# Patient Record
Sex: Female | Born: 1970 | Race: Black or African American | Hispanic: No | Marital: Single | Smoking: Never smoker
Health system: Southern US, Community
[De-identification: ages and names within clinical notes are randomized; demographics above are authoritative.]

## PROBLEM LIST (undated history)

## (undated) DIAGNOSIS — G43829 Menstrual migraine, not intractable, without status migrainosus: Secondary | ICD-10-CM

## (undated) DIAGNOSIS — D649 Anemia, unspecified: Secondary | ICD-10-CM

## (undated) DIAGNOSIS — F32A Depression, unspecified: Secondary | ICD-10-CM

## (undated) DIAGNOSIS — F329 Major depressive disorder, single episode, unspecified: Secondary | ICD-10-CM

## (undated) DIAGNOSIS — L309 Dermatitis, unspecified: Secondary | ICD-10-CM

## (undated) DIAGNOSIS — J309 Allergic rhinitis, unspecified: Secondary | ICD-10-CM

## (undated) HISTORY — DX: Major depressive disorder, single episode, unspecified: F32.9

## (undated) HISTORY — DX: Menstrual migraine, not intractable, without status migrainosus: G43.829

## (undated) HISTORY — PX: WISDOM TOOTH EXTRACTION: SHX21

## (undated) HISTORY — DX: Depression, unspecified: F32.A

## (undated) HISTORY — DX: Allergic rhinitis, unspecified: J30.9

## (undated) HISTORY — DX: Dermatitis, unspecified: L30.9

---

## 1999-09-11 ENCOUNTER — Other Ambulatory Visit: Admission: RE | Admit: 1999-09-11 | Discharge: 1999-09-11 | Payer: Self-pay | Admitting: Obstetrics and Gynecology

## 2000-09-09 ENCOUNTER — Other Ambulatory Visit: Admission: RE | Admit: 2000-09-09 | Discharge: 2000-09-09 | Payer: Self-pay | Admitting: Obstetrics and Gynecology

## 2002-11-29 ENCOUNTER — Emergency Department (HOSPITAL_COMMUNITY): Admission: EM | Admit: 2002-11-29 | Discharge: 2002-11-29 | Payer: Self-pay | Admitting: Emergency Medicine

## 2002-11-29 ENCOUNTER — Encounter: Payer: Self-pay | Admitting: Emergency Medicine

## 2003-02-23 ENCOUNTER — Other Ambulatory Visit: Admission: RE | Admit: 2003-02-23 | Discharge: 2003-02-23 | Payer: Self-pay | Admitting: Obstetrics and Gynecology

## 2004-03-13 ENCOUNTER — Other Ambulatory Visit: Admission: RE | Admit: 2004-03-13 | Discharge: 2004-03-13 | Payer: Self-pay | Admitting: Obstetrics and Gynecology

## 2004-12-12 ENCOUNTER — Encounter: Admission: RE | Admit: 2004-12-12 | Discharge: 2004-12-12 | Payer: Self-pay | Admitting: Family Medicine

## 2005-03-23 ENCOUNTER — Other Ambulatory Visit: Admission: RE | Admit: 2005-03-23 | Discharge: 2005-03-23 | Payer: Self-pay | Admitting: Obstetrics and Gynecology

## 2005-03-30 ENCOUNTER — Ambulatory Visit (HOSPITAL_COMMUNITY): Admission: RE | Admit: 2005-03-30 | Discharge: 2005-03-30 | Payer: Self-pay | Admitting: Obstetrics and Gynecology

## 2005-07-06 HISTORY — PX: BUNIONECTOMY: SHX129

## 2006-02-10 ENCOUNTER — Ambulatory Visit: Payer: Self-pay | Admitting: Family Medicine

## 2006-04-12 ENCOUNTER — Ambulatory Visit (HOSPITAL_COMMUNITY): Admission: RE | Admit: 2006-04-12 | Discharge: 2006-04-12 | Payer: Self-pay | Admitting: Obstetrics and Gynecology

## 2006-04-12 ENCOUNTER — Ambulatory Visit: Payer: Self-pay | Admitting: Family Medicine

## 2006-04-28 ENCOUNTER — Other Ambulatory Visit: Admission: RE | Admit: 2006-04-28 | Discharge: 2006-04-28 | Payer: Self-pay | Admitting: Obstetrics & Gynecology

## 2006-06-15 ENCOUNTER — Other Ambulatory Visit: Admission: RE | Admit: 2006-06-15 | Discharge: 2006-06-15 | Payer: Self-pay | Admitting: Obstetrics & Gynecology

## 2006-08-04 ENCOUNTER — Other Ambulatory Visit: Admission: RE | Admit: 2006-08-04 | Discharge: 2006-08-04 | Payer: Self-pay | Admitting: Obstetrics & Gynecology

## 2006-10-21 ENCOUNTER — Ambulatory Visit: Payer: Self-pay | Admitting: Family Medicine

## 2006-11-26 ENCOUNTER — Ambulatory Visit: Payer: Self-pay | Admitting: Family Medicine

## 2007-05-09 ENCOUNTER — Other Ambulatory Visit: Admission: RE | Admit: 2007-05-09 | Discharge: 2007-05-09 | Payer: Self-pay | Admitting: Obstetrics and Gynecology

## 2007-08-26 ENCOUNTER — Ambulatory Visit: Payer: Self-pay | Admitting: Family Medicine

## 2007-10-07 ENCOUNTER — Ambulatory Visit: Payer: Self-pay | Admitting: Family Medicine

## 2008-05-03 ENCOUNTER — Ambulatory Visit: Payer: Self-pay | Admitting: Family Medicine

## 2008-05-16 ENCOUNTER — Other Ambulatory Visit: Admission: RE | Admit: 2008-05-16 | Discharge: 2008-05-16 | Payer: Self-pay | Admitting: Obstetrics & Gynecology

## 2008-07-16 ENCOUNTER — Ambulatory Visit: Payer: Self-pay | Admitting: Family Medicine

## 2008-08-22 ENCOUNTER — Ambulatory Visit: Payer: Self-pay | Admitting: Family Medicine

## 2008-11-15 ENCOUNTER — Ambulatory Visit: Payer: Self-pay | Admitting: Family Medicine

## 2008-12-28 ENCOUNTER — Ambulatory Visit (HOSPITAL_COMMUNITY): Admission: RE | Admit: 2008-12-28 | Discharge: 2008-12-28 | Payer: Self-pay | Admitting: Obstetrics and Gynecology

## 2009-02-14 ENCOUNTER — Ambulatory Visit: Payer: Self-pay | Admitting: Family Medicine

## 2009-06-03 ENCOUNTER — Ambulatory Visit: Payer: Self-pay | Admitting: Family Medicine

## 2009-06-14 ENCOUNTER — Encounter: Admission: RE | Admit: 2009-06-14 | Discharge: 2009-06-14 | Payer: Self-pay | Admitting: Family Medicine

## 2009-06-17 ENCOUNTER — Ambulatory Visit: Payer: Self-pay | Admitting: Family Medicine

## 2009-12-13 ENCOUNTER — Ambulatory Visit: Payer: Self-pay | Admitting: Family Medicine

## 2009-12-31 ENCOUNTER — Ambulatory Visit (HOSPITAL_COMMUNITY): Admission: RE | Admit: 2009-12-31 | Discharge: 2009-12-31 | Payer: Self-pay | Admitting: Obstetrics and Gynecology

## 2010-09-24 IMAGING — CR DG WRIST COMPLETE 3+V*R*
3 series · 3 of 3 positions shown · non-contrast
Comparison: None

CLINICAL DATA: Wrist pain.  No trauma.

RIGHT WRIST - COMPLETE 3+ VIEW

[view not recorded (1 of 3)]
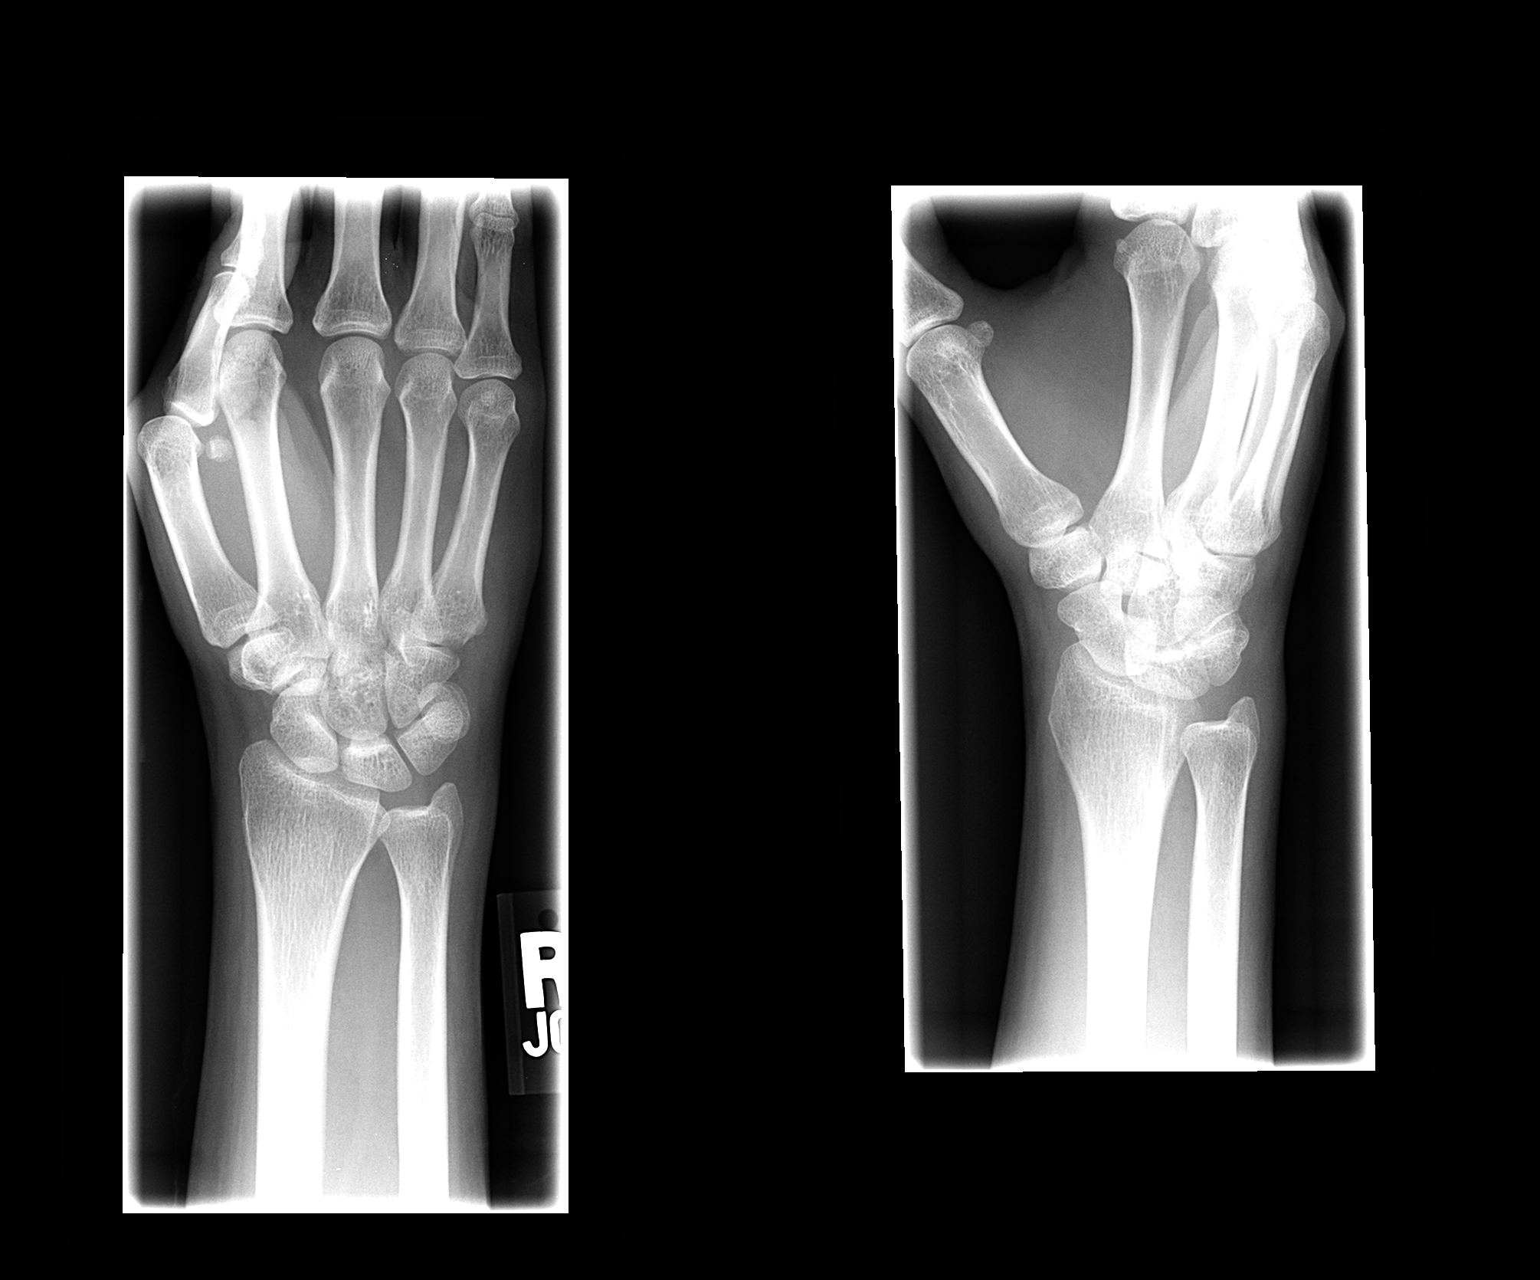

[view not recorded (2 of 3)]
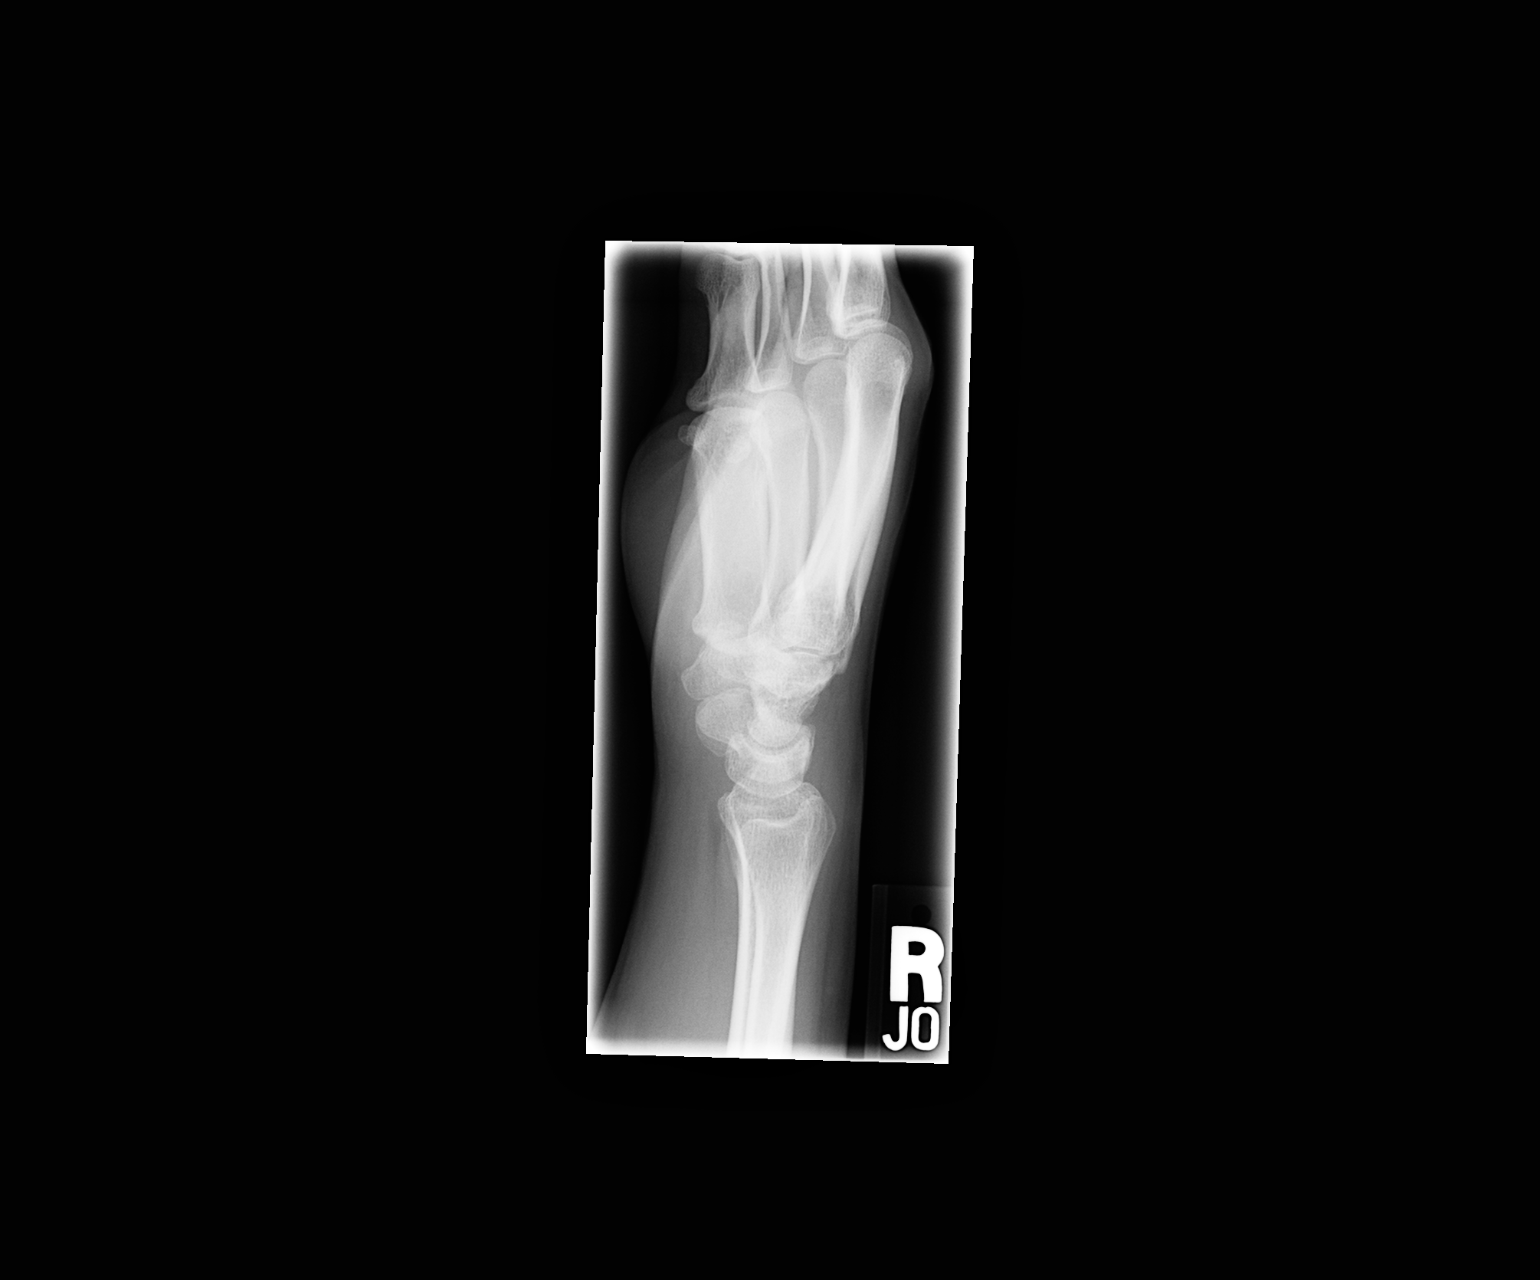

[view not recorded (3 of 3)]
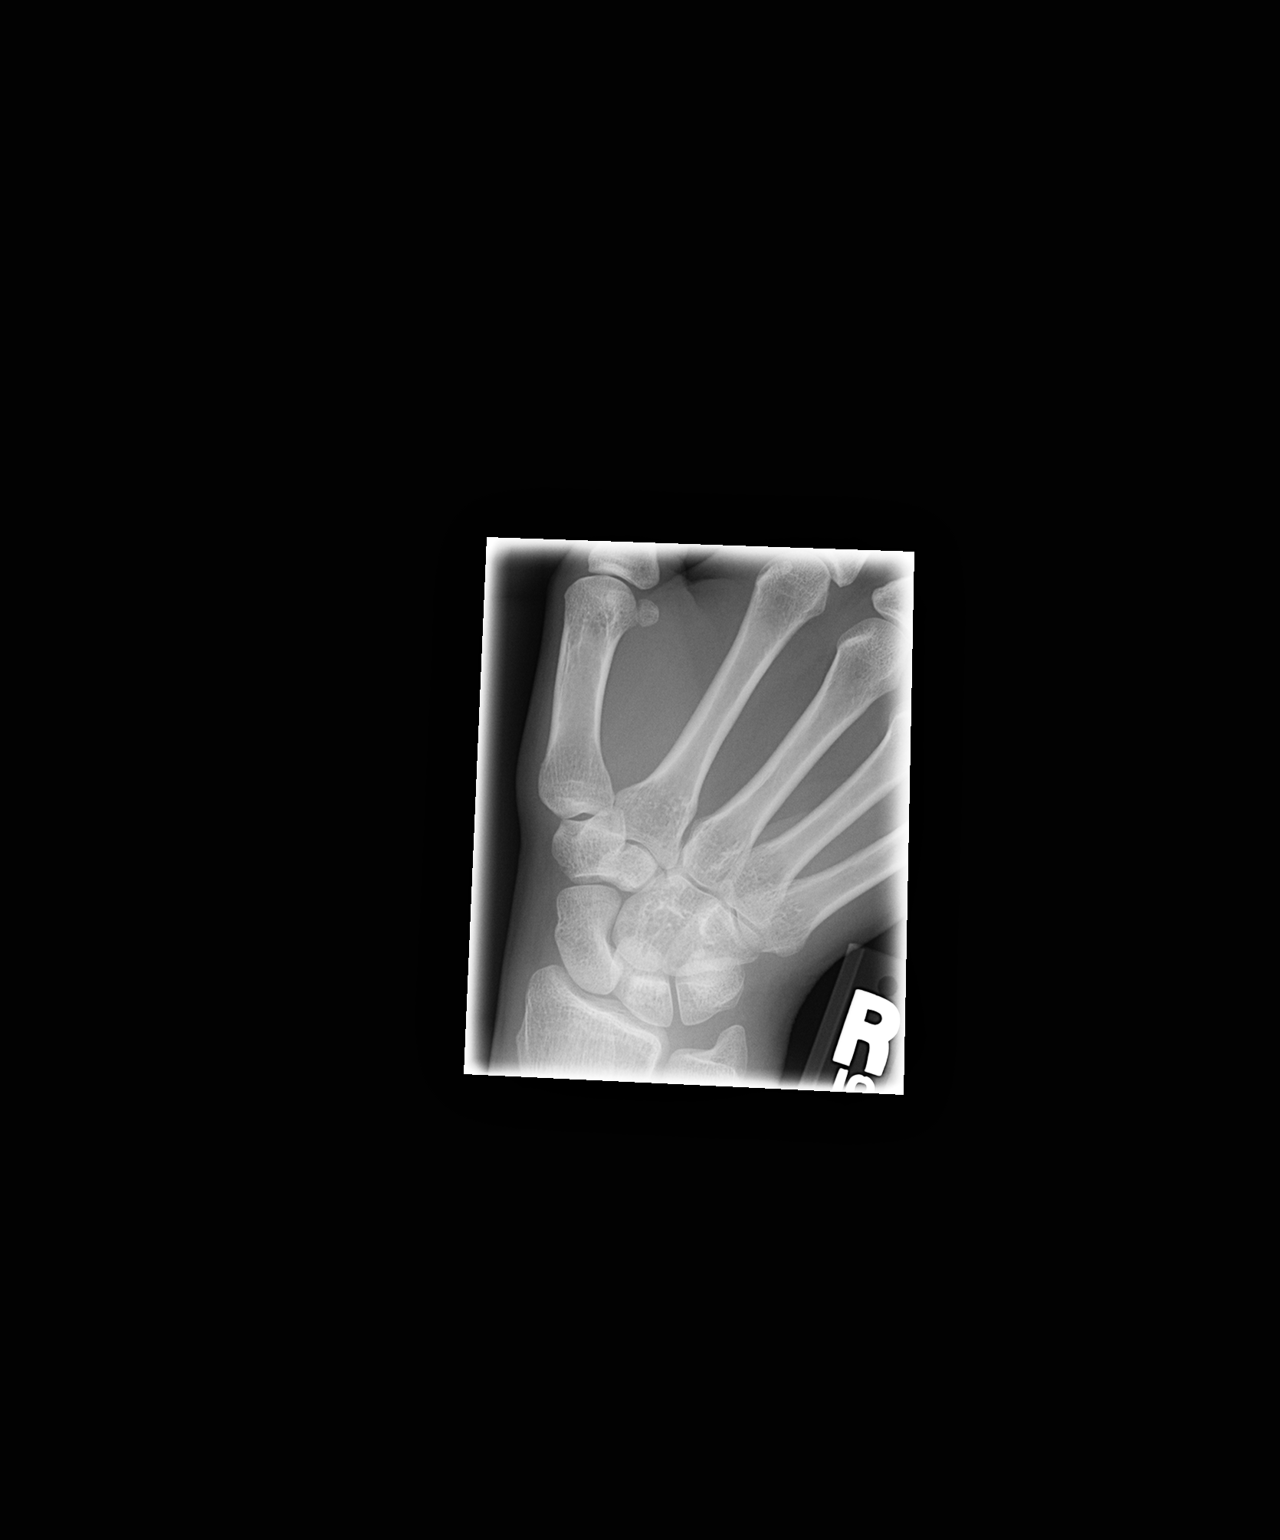

[3 of 3 positions shown; findings below may reference images not displayed]

FINDINGS: No acute bony abnormality.  Specifically, no fracture,
subluxation, or dislocation.  Soft tissues are intact.
IMPRESSION: No acute bony abnormality.

## 2010-11-10 ENCOUNTER — Ambulatory Visit (INDEPENDENT_AMBULATORY_CARE_PROVIDER_SITE_OTHER): Payer: Private Health Insurance - Indemnity | Admitting: Medical

## 2010-11-10 DIAGNOSIS — J309 Allergic rhinitis, unspecified: Secondary | ICD-10-CM

## 2010-11-10 DIAGNOSIS — H6120 Impacted cerumen, unspecified ear: Secondary | ICD-10-CM

## 2010-11-13 ENCOUNTER — Other Ambulatory Visit (HOSPITAL_COMMUNITY): Payer: Self-pay | Admitting: Obstetrics and Gynecology

## 2010-11-13 DIAGNOSIS — Z1231 Encounter for screening mammogram for malignant neoplasm of breast: Secondary | ICD-10-CM

## 2010-11-18 ENCOUNTER — Telehealth: Payer: Self-pay | Admitting: Medical

## 2010-11-18 MED ORDER — AMOXICILLIN 875 MG PO TABS: 875.0000 mg | ORAL_TABLET | Freq: Two times a day (BID) | ORAL | Status: AC

## 2010-11-18 NOTE — Telephone Encounter (Signed)
Patient was called and notified that antibiotic was ready to be pickup.  Patient will pick it up in the morning. CM, LPN

## 2010-11-19 ENCOUNTER — Telehealth: Payer: Self-pay | Admitting: Medical

## 2010-11-19 ENCOUNTER — Encounter: Payer: Self-pay | Admitting: Medical

## 2010-11-19 ENCOUNTER — Other Ambulatory Visit: Payer: Self-pay | Admitting: Medical

## 2010-11-19 MED ORDER — FLUCONAZOLE 150 MG PO TABS: 150.0000 mg | ORAL_TABLET | Freq: Once | ORAL | Status: AC

## 2010-12-03 ENCOUNTER — Telehealth: Payer: Self-pay | Admitting: *Deleted

## 2010-12-03 ENCOUNTER — Encounter: Payer: Self-pay | Admitting: Medical

## 2010-12-03 NOTE — Telephone Encounter (Signed)
Pull chart as this must have been prior to epic note.     This is in reference to phone call today about the antibiotic

## 2010-12-03 NOTE — Telephone Encounter (Signed)
pls CALL in Amoxicillin 875mg  1 tablet BID x 10 days #20 no refill to pharmacy.

## 2010-12-15 ENCOUNTER — Ambulatory Visit (HOSPITAL_COMMUNITY)
Admission: RE | Admit: 2010-12-15 | Discharge: 2010-12-15 | Disposition: A | Discharge status: Home / Self Care | Payer: Private Health Insurance - Indemnity | Source: Ambulatory Visit | Attending: Obstetrics and Gynecology | Admitting: Obstetrics and Gynecology

## 2010-12-15 DIAGNOSIS — Z1231 Encounter for screening mammogram for malignant neoplasm of breast: Secondary | ICD-10-CM

## 2010-12-22 NOTE — Telephone Encounter (Signed)
done

## 2011-01-06 ENCOUNTER — Other Ambulatory Visit: Payer: Self-pay | Admitting: Medical

## 2011-01-06 ENCOUNTER — Telehealth: Payer: Self-pay | Admitting: *Deleted

## 2011-01-06 MED ORDER — CICLESONIDE 50 MCG/ACT NA SUSP: 2.0000 | Freq: Every day | NASAL | Status: DC

## 2011-01-06 NOTE — Telephone Encounter (Addendum)
Message copied by Dorthula Perfect on Tue Jan 06, 2011  2:17 PM ------      Message from: Jac Canavan      Created: Tue Jan 06, 2011  1:56 PM       Pt called requesting script for omnaris, as the flonase doesn't work.  I will send script.   Pt notified rx for omnaris sent to pharmacy.  CM, LPN

## 2011-01-27 ENCOUNTER — Other Ambulatory Visit: Payer: Self-pay | Admitting: Medical

## 2011-01-27 MED ORDER — MOMETASONE FUROATE 50 MCG/ACT NA SUSP: 2.0000 | Freq: Every day | NASAL | Status: DC

## 2011-01-28 ENCOUNTER — Telehealth: Payer: Self-pay | Admitting: Medical

## 2011-01-28 NOTE — Telephone Encounter (Signed)
Sample ready on your desk

## 2011-01-29 NOTE — Telephone Encounter (Signed)
Called pt and left message that Nasonex was ready to be picked up at front desk.  CM, LPN

## 2011-01-30 NOTE — Telephone Encounter (Signed)
Handled by cora °

## 2011-02-04 ENCOUNTER — Ambulatory Visit (INDEPENDENT_AMBULATORY_CARE_PROVIDER_SITE_OTHER): Payer: Private Health Insurance - Indemnity | Admitting: Family Medicine

## 2011-02-04 ENCOUNTER — Encounter: Payer: Self-pay | Admitting: Family Medicine

## 2011-02-04 VITALS — BP 86/60 | HR 92 | Wt 159.0 lb

## 2011-02-04 DIAGNOSIS — J309 Allergic rhinitis, unspecified: Secondary | ICD-10-CM

## 2011-02-04 DIAGNOSIS — J302 Other seasonal allergic rhinitis: Secondary | ICD-10-CM | POA: Insufficient documentation

## 2011-02-04 DIAGNOSIS — J3089 Other allergic rhinitis: Secondary | ICD-10-CM

## 2011-02-04 NOTE — Progress Notes (Signed)
  Subjective:    Patient ID: [name], female    DOB: Jun 30, 1971, 40 y.o.   MRN: [id number]  HPI History of allergies and presently is on Nasonex and has also taken Zyrtec. This combination is not working. She is having difficulty with rhinorrhea and PND. Also itchy watery eyes   Review of Systems     Objective:   Physical Exam alert and in no distress. Tympanic membranes and canals are normal. Throat is clear. Tonsils are normal. Neck is supple without adenopathy or thyromegaly. Cardiac exam shows a regular sinus rhythm without murmurs or gallops. Lungs are clear to auscultation.       Assessment & Plan:  Allergic rhinitis Continue on your Nasonex and to try Allegra and if that doesn't work she will call

## 2011-02-04 NOTE — Patient Instructions (Signed)
Try Allegra and Claritin and if no improvement, call for a prescription medicine

## 2011-09-02 ENCOUNTER — Encounter: Payer: Self-pay | Admitting: Family Medicine

## 2011-09-02 ENCOUNTER — Ambulatory Visit (INDEPENDENT_AMBULATORY_CARE_PROVIDER_SITE_OTHER): Payer: Private Health Insurance - Indemnity | Admitting: Family Medicine

## 2011-09-02 VITALS — BP 98/68 | HR 64 | Temp 98.3°F | Ht 65.0 in | Wt 158.0 lb

## 2011-09-02 DIAGNOSIS — J309 Allergic rhinitis, unspecified: Secondary | ICD-10-CM

## 2011-09-02 DIAGNOSIS — J302 Other seasonal allergic rhinitis: Secondary | ICD-10-CM

## 2011-09-02 MED ORDER — FLUTICASONE PROPIONATE 50 MCG/ACT NA SUSP: 2.0000 | Freq: Every day | NASAL | Status: DC

## 2011-09-02 NOTE — Progress Notes (Signed)
Chief complaint: lots of sinus drainage, coughing and sneezing x 10 days.  HPI:  Started having sinus pressure in her cheeks last week, sneezing.  Started taking Zyrtec, which helped some.  2 days ago had sore throat, having postnasal drainage.  Last night she was coughing a lot, related to the drainage.  Not having much mucus from nose.  Only a small amount of phlegm, first thing in the morning, slightly yellow.  Cough last night, that kept her awake, was dry, nonproductive.  Denies fevers.  Also tried some day and night cold meds, with some improvement.    Has been out of Nasonex for about a month. Recalls having used Flonase in the past, and didn't recall side effects.  Hadn't gotten Nasonex refilled due to the expense.  Past Medical History  Diagnosis Date  . Allergic rhinitis, cause unspecified   . Menstrual migraine     resolved after getting IUD  . Depression     history; resolved  . Eczema     elbow    Past Surgical History  Procedure Date  . Bunionectomy 2007    bilateral    History   Social History  . Marital Status: Single    Spouse Name: N/A    Number of Children: N/A  . 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 Smoking status: Never Smoker   . Smokeless tobacco: Never Used  . Alcohol Use: Yes     1 glass of wine 1-2 times per month.  . Drug Use: No  . Sexually Active: Yes -- Female partner(s)    Birth Control/ Protection: IUD     Mirena    Other Topics Concern  . Not on file   Social History Narrative  . No narrative on file    Family History  Problem Relation Age of Onset  . Hypertension Mother   . Hypertension Sister     Current outpatient prescriptions:fluticasone (FLONASE) 50 MCG/ACT nasal spray, Place 2 sprays into the nose daily., Disp: 16 g, Rfl: 6  Allergies  Allergen Reactions  . Codeine Other (See Comments)    Hyperactivity.    ROS:  Denies fevers, cough,  chest pain, palpitations, nausea, vomiting, diarrhea  PHYSICAL EXAM: BP 98/68  Pulse 64  Temp(Src) 98.3 F (36.8 C) (Oral)  Ht 5\' 5"  (1.651 m)  Wt 158 lb (71.668 kg)  BMI 26.29 kg/m2 Well developed, pleasant female in no distress HEENT:  PERRL, EOMI, Nasal mucosa moderately edematous on L, mild on R, pale, no erythema or purulence.  Sinuses nontender, OP clear Neck: no lymphadenopathy, thyromegaly or mass Lungs clear Heart: regular rate and rhythm without murmur Abdomen: soft, nontender, no mass Extremities: no clubbing, cyanosis or edema  ASSESSMENT/PLAN:  1. Seasonal and perennial allergic rhinitis  fluticasone (FLONASE) 50 MCG/ACT nasal spray   Restart nasal steroid spray. Use 2 sprays into each nostril once daily (may cut back to 1 spray when improving, if able to, but okay to continue longterm at 2 sprays). Continue zyrtec daily. Use decongestant (ie sudafed) only if needed for sinus pain Add guaifenesin (mucinex or Robitussin) to help loosen mucus and help with cough.  You can either use the plain medication, or the DM form.  If you buy the plain, and still have significant cough, you can get a separate Delsym syrup (which is the DM alone--cough suppressant).  If mucus becomes discolored, increased amounts, fevers,  worsening sinus pain, then you likely have developed an infection, requiring antibiotics. No evidence of infection today.

## 2011-09-02 NOTE — Patient Instructions (Signed)
Restart nasal steroid spray. Use 2 sprays into each nostril once daily (may cut back to 1 spray when improving, if able to, but okay to continue longterm at 2 sprays). Continue zyrtec daily. Use decongestant (ie sudafed) only if needed for sinus pain Add guaifenesin (mucinex or Robitussin) to help loosen mucus and help with cough.  You can either use the plain medication, or the DM form.  If you buy the plain, and still have significant cough, you can get a separate Delsym syrup (which is the DM alone--cough suppressant).  If mucus becomes discolored, increased amounts, fevers, worsening sinus pain, then you likely have developed an infection, requiring antibiotics. No evidence of infection today.

## 2011-11-25 ENCOUNTER — Other Ambulatory Visit (HOSPITAL_COMMUNITY): Payer: Self-pay | Admitting: Obstetrics and Gynecology

## 2011-11-25 DIAGNOSIS — Z1231 Encounter for screening mammogram for malignant neoplasm of breast: Secondary | ICD-10-CM

## 2011-12-22 ENCOUNTER — Ambulatory Visit (HOSPITAL_COMMUNITY): Payer: Private Health Insurance - Indemnity

## 2012-01-19 ENCOUNTER — Ambulatory Visit (HOSPITAL_COMMUNITY): Payer: Private Health Insurance - Indemnity

## 2012-02-05 ENCOUNTER — Ambulatory Visit (HOSPITAL_COMMUNITY)
Admission: RE | Admit: 2012-02-05 | Discharge: 2012-02-05 | Disposition: A | Discharge status: Home / Self Care | Payer: Private Health Insurance - Indemnity | Source: Ambulatory Visit | Attending: Obstetrics and Gynecology | Admitting: Obstetrics and Gynecology

## 2012-02-05 DIAGNOSIS — Z1231 Encounter for screening mammogram for malignant neoplasm of breast: Secondary | ICD-10-CM

## 2012-02-25 ENCOUNTER — Encounter: Payer: Self-pay | Admitting: Family Medicine

## 2012-02-25 ENCOUNTER — Ambulatory Visit (INDEPENDENT_AMBULATORY_CARE_PROVIDER_SITE_OTHER): Payer: Private Health Insurance - Indemnity | Admitting: Family Medicine

## 2012-02-25 VITALS — BP 110/70 | Wt 159.0 lb

## 2012-02-25 DIAGNOSIS — T07XXXA Unspecified multiple injuries, initial encounter: Secondary | ICD-10-CM

## 2012-02-25 DIAGNOSIS — IMO0002 Reserved for concepts with insufficient information to code with codable children: Secondary | ICD-10-CM

## 2012-02-25 NOTE — Progress Notes (Signed)
  Subjective:    Patient ID: [name], female    DOB: [birth year], 41 y.o.   MRN: [id number]  HPI She fell while running and abraded her chin, right elbow, palm of the hand proximally and right knee.   Review of Systems     Objective:   Physical Exam Abrasions to the above areas are noted. She has good occlusion of her teeth. Exam of the elbow shows no swelling or deformity with good motion. Exam of the hand does show an abrasion present that does seem to be healing nicely. There is no bony tenderness. Knee exam again shows an abrasion with no underlying damage.        Assessment & Plan:   1. Abrasions of multiple sites    E. the areas clean and dry. Return if any problems.

## 2012-05-23 ENCOUNTER — Ambulatory Visit (INDEPENDENT_AMBULATORY_CARE_PROVIDER_SITE_OTHER): Payer: Private Health Insurance - Indemnity | Admitting: Family Medicine

## 2012-05-23 ENCOUNTER — Encounter: Payer: Self-pay | Admitting: Family Medicine

## 2012-05-23 VITALS — BP 110/70 | Wt 155.0 lb

## 2012-05-23 DIAGNOSIS — M2240 Chondromalacia patellae, unspecified knee: Secondary | ICD-10-CM

## 2012-05-23 NOTE — Progress Notes (Signed)
  Subjective:    Patient ID: [name], female    DOB: 1970-07-16, 41 y.o.   MRN: [id number]  HPI She is here for evaluation of left knee pain. It started after she finished a run. She has been running for 6 months. No history of injury, swelling, locking, popping or grinding. She has been using bio freeze which has allowed her to continue to run. She notes pain especially going down stairs and if she sits for long periods of time. If she gets up and move around the pain does diminish.   Review of Systems     Objective:   Physical Exam No point tenderness noted. Compression test was uncomfortable. Slight discomfort to palpation to the lateral and medial patella. McMurray's and anterior drawer negative. No effusion noted.       Assessment & Plan:   1. Chondromalacia, patella   Continue with Bio freeze. You can also do heat before you run and ice afterwards. I also want you to do terminal extension exercises; 3 sets of 10 3 times a day for the next 3 weeks She will return here if continued difficulty.

## 2012-05-23 NOTE — Patient Instructions (Addendum)
Continue with Bio freeze. You can also do heat before you run and ice afterwards. I also want you to do terminal extension exercises; 3 sets of 10 3 times a day for the next 3 weeks

## 2012-07-18 ENCOUNTER — Encounter: Payer: Self-pay | Admitting: Family Medicine

## 2012-07-18 ENCOUNTER — Ambulatory Visit (INDEPENDENT_AMBULATORY_CARE_PROVIDER_SITE_OTHER): Payer: Private Health Insurance - Indemnity | Admitting: Family Medicine

## 2012-07-18 VITALS — BP 108/70 | HR 68 | Temp 98.2°F | Ht 65.0 in | Wt 157.0 lb

## 2012-07-18 DIAGNOSIS — R05 Cough: Secondary | ICD-10-CM

## 2012-07-18 DIAGNOSIS — J3089 Other allergic rhinitis: Secondary | ICD-10-CM

## 2012-07-18 DIAGNOSIS — J309 Allergic rhinitis, unspecified: Secondary | ICD-10-CM

## 2012-07-18 DIAGNOSIS — R6883 Chills (without fever): Secondary | ICD-10-CM

## 2012-07-18 LAB — POCT INFLUENZA A/B: Influenza A, POC: NEGATIVE

## 2012-07-18 NOTE — Progress Notes (Signed)
Chief Complaint  Patient presents with  . Cough    started on Saturday. Sinus pressure and nose is dry. She says she has "that sick taste" in her throat that she can taste.   HPI:  Started coughing 2 nights ago, and yesterday she had chills, felt "like a bus hit her", couldn't get warm.  Boyfriend didn't think she felt warm/febrile.  Has headache behind her eyes since yesterday.  +nasal congestion, and coughing up yellowy phlegm.  +PND.  Some chest pressure when she coughs.  Has a bad taste in her mouth. Myalgias aren't nearly as bad today. Hasn't taken any pain meds since last night.  +sick contacts at work, no close contacts (just in same building at work).  Past Medical History  Diagnosis Date  . Allergic rhinitis, cause unspecified   . Menstrual migraine     resolved after getting IUD  . Depression     history; resolved  . Eczema     elbow   Past Surgical History  Procedure Date  . Bunionectomy 2007    bilateral   History   Social History  . Marital Status: Single    Spouse Name: N/A    Number of Children: N/A  . 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 Smoking status: Never Smoker   . Smokeless tobacco: Never Used  . Alcohol Use: Yes     Comment: 1 glass of wine 1-2 times per month.  . Drug Use: No  . Sexually Active: Yes -- Female partner(s)    Birth Control/ Protection: IUD     Comment: Mirena    Other Topics Concern  . Not on file   Social History Narrative  . No narrative on file   Current outpatient prescriptions:GuaiFENesin (MUCINEX PO), Take 400 mg by mouth every 4 (four) hours., Disp: , Rfl: ;  naproxen sodium (ANAPROX) 220 MG tablet, Take 220 mg by mouth 2 (two) times daily with a meal., Disp: , Rfl: ;  fluticasone (FLONASE) 50 MCG/ACT nasal spray, Place 2 sprays into the nose daily., Disp: 16 g, Rfl: 6;  Probiotic Product (PROBIOTIC DAILY PO), Take 1 capsule by mouth daily.,  Disp: , Rfl:   Allergies  Allergen Reactions  . Codeine Other (See Comments)    Hyperactivity.   ROS:  Denies chest pain, palpitations, nausea, vomiting, abdominal pain, bowel changes.  No fevers. +cough.  No urinary complaints or other concerns.  PHYSICAL EXAM: BP 108/70  Pulse 68  Temp 98.2 F (36.8 C) (Oral)  Ht 5\' 5"  (1.651 m)  Wt 157 lb (71.215 kg)  BMI 26.13 kg/m2 Well developed, mildly ill-appearing/fatigued female in no distress Nasal mucosa--mildly edematous, L>R, no erythema Mildly tender over L maxillary sinus OP normal No lymphadenopathy or mass Heart: regular rate and rhythm without murmur Lungs: clear bilaterally  ASSESSMENT/PLAN:  1. Seasonal and perennial allergic rhinitis    2. Chills  Influenza A/B  3. Cough  Influenza A/B   Flu test negative. Suspect flu-like viral illness.  Supportive measures to include decongestants prn, sinuse rinses, guaifenesin.  F/u prn persistence/worsening of symptoms

## 2012-07-18 NOTE — Patient Instructions (Signed)
Try decongestants (ie Sudafed), sinus rinses.  Continue to use guaifenein (mucinex).  Call next week for antibiotics if symptoms are persisting/worsening for sinus infection.  If respiratory symptoms are worse, please return for re-eval (ie shortness of breath, chest tightness, cough).

## 2012-09-27 ENCOUNTER — Telehealth: Payer: Self-pay | Admitting: Family Medicine

## 2012-09-27 DIAGNOSIS — J309 Allergic rhinitis, unspecified: Secondary | ICD-10-CM

## 2012-09-27 MED ORDER — CICLESONIDE 37 MCG/ACT NA AERS: 1.0000 | INHALATION_SPRAY | Freq: Every day | NASAL | Status: DC

## 2012-09-27 NOTE — Telephone Encounter (Signed)
Pt called she has sneezed 20 - 30 times already this morning.  Pt wants you to call in Zetonna nasal spray to [facility] in [place].

## 2012-09-27 NOTE — Telephone Encounter (Signed)
done

## 2012-10-03 ENCOUNTER — Telehealth: Payer: Self-pay | Admitting: Family Medicine

## 2012-10-03 DIAGNOSIS — J309 Allergic rhinitis, unspecified: Secondary | ICD-10-CM

## 2012-10-03 MED ORDER — FLUNISOLIDE 25 MCG/ACT (0.025%) NA SOLN: 2.0000 | Freq: Two times a day (BID) | NASAL | Status: DC

## 2012-10-03 NOTE — Telephone Encounter (Signed)
CAN WE SWITCH TO COVERED ALTERNATIVE, OR SEND LETTER OF MEDICAL NECESSITY AS TO WHY THIS NEEDS TO BE ADDED TO HER FORMULARY

## 2012-10-03 NOTE — Telephone Encounter (Signed)
Spoke with patient and she has already tried flonase and nasonex in the past without good results. She would like to try Nasalide. I will call in for her.

## 2012-10-03 NOTE — Telephone Encounter (Signed)
Advise pt zetonna isn't covered and see if she has a preference for alternatives.  Okay to rx whichever she prefers

## 2012-11-24 ENCOUNTER — Ambulatory Visit (INDEPENDENT_AMBULATORY_CARE_PROVIDER_SITE_OTHER): Payer: Private Health Insurance - Indemnity | Admitting: Family Medicine

## 2012-11-24 ENCOUNTER — Encounter: Payer: Self-pay | Admitting: Family Medicine

## 2012-11-24 VITALS — BP 92/60 | HR 64 | Temp 97.6°F | Ht 65.0 in | Wt 159.0 lb

## 2012-11-24 DIAGNOSIS — J302 Other seasonal allergic rhinitis: Secondary | ICD-10-CM

## 2012-11-24 DIAGNOSIS — J309 Allergic rhinitis, unspecified: Secondary | ICD-10-CM

## 2012-11-24 NOTE — Patient Instructions (Addendum)
Continue your nasal steroid spray and zyrtec. You may add back a decongestant (ie sudafed --the PE is on the shelf, the pseudoephedrine is behind the counter), and use Mucinex as needed.  You can use the plain mucinex (expectorant), or the DM version if cough is worse.  DM is dextromethorphan--it can be found in Delsym syrup (without guaifenesin), or in the Mucinex DM (which contains both expectorant and cough suppressant).    Return if fevers, worsening cough, discolored mucus, shortness of breath, or other concerns

## 2012-11-24 NOTE — Progress Notes (Signed)
Chief Complaint  Patient presents with  . Cough    her allergies are still making her nose run and she has drainage.    2 weeks ago she needed to take mucinex and decongestants on top of zyrtec because allergies were so bad.  That has improved, and energy is better, but is left with a nagging cough.  Cough is worse at night, dry, but sometimes gets up clear mucus.  Denies fevers, no discolored mucus.  Some ongoing runny nose, mild, worse when she runs.  She is using allergy eye drops prior to putting in her contacts, and that helps.  She recently ran[place]walk the [address] in [place], over the past weekend.  Past Medical History  Diagnosis Date  . Allergic rhinitis, cause unspecified   . Menstrual migraine     resolved after getting IUD  . Depression     history; resolved  . Eczema     elbow   Past Surgical History  Procedure Laterality Date  . Bunionectomy  2007    bilateral   History   Social History  . Marital Status: Single    Spouse Name: N/A    Number of Children: N/A  . 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 Smoking status: Never Smoker   . Smokeless tobacco: Never Used  . Alcohol Use: Yes     Comment: 1 glass of wine 1-2 times per month.  . Drug Use: No  . Sexually Active: Yes -- Female partner(s)    Birth Control/ Protection: IUD     Comment: Mirena    Other Topics Concern  . Not on file   Social History Narrative  . No narrative on file   Current outpatient prescriptions:cetirizine (ZYRTEC) 10 MG tablet, Take 10 mg by mouth daily., Disp: , Rfl: ;  flunisolide (NASALIDE) 25 MCG/ACT (0.025%) SOLN, Inhale 2 sprays into the lungs 2 (two) times daily., Disp: 1 Bottle, Rfl: 0;  GuaiFENesin (MUCINEX PO), Take 400 mg by mouth every 4 (four) hours., Disp: , Rfl: ;  Probiotic Product (PROBIOTIC DAILY PO), Take 1 capsule by mouth daily., Disp: , Rfl:  (not currently using probiotic or  mucinex)  Allergies  Allergen Reactions  . Codeine Other (See Comments)    Hyperactivity.   ROS: Denies fevers, nausea, vomiting, diarrhea.  Denies headaches (resolved, had a few weeks ago). No shortness of breath, chest pain, urinary complaints, rashes, bleeding/bruising or other concerns.  PHYSICAL EXAM: BP 92/60  Pulse 64  Temp(Src) 97.6 F (36.4 C) (Oral)  Ht 5\' 5"  (1.651 m)  Wt 159 lb (72.122 kg)  BMI 26.46 kg/m2 Well developed, pleasant female, in no distress.  Occasional throat-clearing and dry cough HEENT:  PERRL, EOMI, conjunctiva clear.  TM's and EAC's normal.  Nasal mucosa moderately edematous, pale/bluish.  No purulence, small amount of clear mucus.  Sinuses nontender.  OP clear Neck: no lymphadenopathy, thyromegaly or mass Heart: regular rate and rhythm without murmur Lungs: clear bilaterally.  No wheezes with forced expiration Skin: normal, no rash Neuro: alert and oriented.  Cranial nerves intact.  Normal strength, sensation, gait  ASSESSMENT/PLAN:  Seasonal and perennial allergic rhinitis  Continue nasal steroid, zyrtec.  May use decongestant and guaifenesin prn, dextromethorphan prn.  Reviewed s/sx of infection, to call or return should symptoms develop.  Reviewed proper nasal spray technique.  F/u prn

## 2012-12-14 ENCOUNTER — Telehealth: Payer: Self-pay | Admitting: Family Medicine

## 2012-12-14 NOTE — Telephone Encounter (Signed)
Please call, she is a runner and is having ankle swelling and wanted advise from you because she knows you are a runner too

## 2012-12-16 ENCOUNTER — Ambulatory Visit (INDEPENDENT_AMBULATORY_CARE_PROVIDER_SITE_OTHER): Payer: Private Health Insurance - Indemnity | Admitting: Family Medicine

## 2012-12-16 ENCOUNTER — Encounter: Payer: Self-pay | Admitting: Family Medicine

## 2012-12-16 VITALS — BP 94/58 | HR 68 | Ht 65.0 in | Wt 162.0 lb

## 2012-12-16 DIAGNOSIS — R609 Edema, unspecified: Secondary | ICD-10-CM

## 2012-12-16 NOTE — Patient Instructions (Addendum)
Peripheral Edema You have swelling in your legs (peripheral edema). This swelling is due to excess accumulation of salt and water in your body. Edema may be a sign of heart, kidney or liver disease, or a side effect of a medication. It may also be due to problems in the leg veins. Elevating your legs and using special support stockings may be very helpful, if the cause of the swelling is due to poor venous circulation. Avoid long periods of standing, whatever the cause. Treatment of edema depends on identifying the cause. Chips, pretzels, pickles and other salty foods should be avoided. Restricting salt in your diet is almost always needed. Water pills (diuretics) are often used to remove the excess salt and water from your body via urine. These medicines prevent the kidney from reabsorbing sodium. This increases urine flow. Diuretic treatment may also result in lowering of potassium levels in your body. Potassium supplements may be needed if you have to use diuretics daily. Daily weights can help you keep track of your progress in clearing your edema. You should call your caregiver for follow up care as recommended. SEEK IMMEDIATE MEDICAL CARE IF:   You have increased swelling, pain, redness, or heat in your legs.  You develop shortness of breath, especially when lying down.  You develop chest or abdominal pain, weakness, or fainting.  You have a fever. Document Released: 07/30/2004 Document Revised: 09/14/2011 Document Reviewed: 07/10/2009 ExitCare Patient Information 2014 [employer].  Sodium-Controlled Diet Sodium is a mineral. It is found in many foods. Sodium may be found naturally or added during the making of a food. The most common form of sodium is salt, which is made up of sodium and chloride. Reducing your sodium intake involves changing your eating habits. The following guidelines will help you reduce the sodium in your diet:  Stop using the salt shaker.  Use salt sparingly in  cooking and baking.  Substitute with sodium-free seasonings and spices.  Do not use a salt substitute (potassium chloride) without your caregiver's permission.  Include a variety of fresh, unprocessed foods in your diet.  Limit the use of processed and convenience foods that are high in sodium. USE THE FOLLOWING FOODS SPARINGLY: Breads/Starches  Commercial bread stuffing, commercial pancake or waffle mixes, coating mixes. Waffles. Croutons. Prepared (boxed or frozen) potato, rice, or noodle mixes that contain salt or sodium. Salted French fries or hash browns. Salted popcorn, breads, crackers, chips, or snack foods. Vegetables  Vegetables canned with salt or prepared in cream, butter, or cheese sauces. Sauerkraut. Tomato or vegetable juices canned with salt.  Fresh vegetables are allowed if rinsed thoroughly. Fruit  Fruit is okay to eat. Meat and Meat Substitutes  Salted or smoked meats, such as bacon or Canadian bacon, chipped or corned beef, hot dogs, salt pork, luncheon meats, pastrami, ham, or sausage. Canned or smoked fish, poultry, or meat. Processed cheese or cheese spreads, blue or Roquefort cheese. Battered or frozen fish products. Prepared spaghetti sauce. Baked beans. Reuben sandwiches. Salted nuts. Caviar. Milk  Limit buttermilk to 1 cup per week. Soups and Combination Foods  Bouillon cubes, canned or dried soups, broth, consomm. Convenience (frozen or packaged) dinners with more than 600 mg sodium. Pot pies, pizza, Asian food, fast food cheeseburgers, and specialty sandwiches. Desserts and Sweets  Regular (salted) desserts, pie, commercial fruit snack pies, commercial snack cakes, canned puddings.  Eat desserts and sweets in moderation. Fats and Oils  Gravy mixes or canned gravy. No more than 1 to 2   tbs of salad dressing. Chip dips.  Eat fats and oils in moderation. Beverages  See those listed under the vegetables and milk groups. Condiments  Ketchup,  mustard, meat sauces, salsa, regular (salted) and lite soy sauce or mustard. Dill pickles, olives, meat tenderizer. Prepared horseradish or pickle relish. Dutch-processed cocoa. Baking powder or baking soda used medicinally. Worcestershire sauce. "Light" salt. Salt substitute, unless approved by your caregiver. Document Released: 12/12/2001 Document Revised: 09/14/2011 Document Reviewed: 07/15/2009 ExitCare Patient Information 2014 [employer].  

## 2012-12-16 NOTE — Progress Notes (Signed)
Chief Complaint  Patient presents with  . Edema    ankle swelling. Has ran/walked every day since June 1st. Wonder if this is from overexertion.   Noticed R ankle swelling earlier this week, and yesterday noticed the left ankle swelling.  The swelling is only medially, and there is no pain or tenderness. Since June 1st she is either running or walking at least a mile every day (walking 2-3, running 4 miles).  She knows she is a "neutral runner", wondering if that could change.  She called [employer], who recommend she come to be refitted for sneakers given her concerns.  She denies any pain, injury/twisting/fall.  Does admit to some increased sodium intake (popcorn).  One of her jobs is very sedentary.   Past Medical History  Diagnosis Date  . Allergic rhinitis, cause unspecified   . Menstrual migraine     resolved after getting IUD  . Depression     history; resolved  . Eczema     elbow   Past Surgical History  Procedure Laterality Date  . Bunionectomy  2007    bilateral   History   Social History  . Marital Status: Single    Spouse Name: N/A    Number of Children: N/A  . 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 Smoking status: Never Smoker   . Smokeless tobacco: Never Used  . Alcohol Use: Yes     Comment: 1 glass of wine 1-2 times per month.  . Drug Use: No  . Sexually Active: Yes -- Female partner(s)    Birth Control/ Protection: IUD     Comment: Mirena    Other Topics Concern  . Not on file   Social History Narrative  . No narrative on file   Current Outpatient Prescriptions on File Prior to Visit  Medication Sig Dispense Refill  . cetirizine (ZYRTEC) 10 MG tablet Take 10 mg by mouth daily.      . flunisolide (NASALIDE) 25 MCG/ACT (0.025%) SOLN Inhale 2 sprays into the lungs 2 (two) times daily.  1 Bottle  0  . GuaiFENesin (MUCINEX PO) Take 400 mg by mouth every 4 (four) hours.      .  Probiotic Product (PROBIOTIC DAILY PO) Take 1 capsule by mouth daily.       No current facility-administered medications on file prior to visit.   Allergies  Allergen Reactions  . Codeine Other (See Comments)    Hyperactivity.   ROS:  Denies fevers, chest pain, shortness of breath, cough, GI complaints, pelvic pain, leg pain, numbness, tingling, urinary complaints, bleeding/bruising or other problems.  PHYSICAL EXAM: BP 94/58  Pulse 68  Ht 5\' 5"  (1.651 m)  Wt 162 lb (73.483 kg)  BMI 26.96 kg/m2 Well developed, pleasant, talkative female in no distress Extremities:  WHSS (from bunionectomy).  Skin intact with lesions or rashes. Trace pitting edema bilaterally, R>L--this is NOT just medially, but across whole ankle, and laterally as well. 2+ pulses FROM, nontender, no pain Slight focal swelling on top of left foot, centrally.  nontender--likely a small ganglion cyst.  ASSESSMENT/PLAN:  Peripheral edema  Reviewed ddx of edema.  Doubt serious pathology (DVT, pelvic mass, nephrotic syndrome, arthritis); relatively new onset.  Can't quiet tell from history if is truly dependent edema.  May have component of venous stasis.  Recommended low sodium diet, keep legs elevated when swollen.  Take breaks when sedentary.  Doubt MSK or related to her activities given diffuse swelling and lack of pain.  Return if worsening edema, at which time further w/u may need to be initiated.

## 2012-12-16 NOTE — Telephone Encounter (Signed)
Seen today. 

## 2013-01-23 ENCOUNTER — Other Ambulatory Visit (HOSPITAL_COMMUNITY): Payer: Self-pay | Admitting: Obstetrics and Gynecology

## 2013-01-23 DIAGNOSIS — Z1231 Encounter for screening mammogram for malignant neoplasm of breast: Secondary | ICD-10-CM

## 2013-02-07 ENCOUNTER — Ambulatory Visit (HOSPITAL_COMMUNITY)
Admission: RE | Admit: 2013-02-07 | Discharge: 2013-02-07 | Disposition: A | Discharge status: Home / Self Care | Payer: Private Health Insurance - Indemnity | Source: Ambulatory Visit | Attending: Obstetrics and Gynecology | Admitting: Obstetrics and Gynecology

## 2013-02-07 DIAGNOSIS — Z1231 Encounter for screening mammogram for malignant neoplasm of breast: Secondary | ICD-10-CM | POA: Insufficient documentation

## 2013-03-07 ENCOUNTER — Encounter: Payer: Self-pay | Admitting: Family Medicine

## 2013-03-07 ENCOUNTER — Ambulatory Visit (INDEPENDENT_AMBULATORY_CARE_PROVIDER_SITE_OTHER): Payer: Private Health Insurance - Indemnity | Admitting: Family Medicine

## 2013-03-07 VITALS — BP 120/80 | HR 67 | Wt 158.0 lb

## 2013-03-07 DIAGNOSIS — H6123 Impacted cerumen, bilateral: Secondary | ICD-10-CM

## 2013-03-07 DIAGNOSIS — H6120 Impacted cerumen, unspecified ear: Secondary | ICD-10-CM

## 2013-03-07 NOTE — Progress Notes (Signed)
  Subjective:    Patient ID: [name], female    DOB: 1970/07/10, 42 y.o.   MRN: [id number]  HPI She complains of difficulty hearing from both ears. She has a previous history of difficulty with cerumen impaction.   Review of Systems     Objective:   Physical Exam Cerumen was present in both canals    Assessment & Plan:  Cerumen impaction, bilateral  Both canals were filled with cerumen and were lavaged without difficulty the TMs were normal.

## 2013-05-31 ENCOUNTER — Ambulatory Visit (INDEPENDENT_AMBULATORY_CARE_PROVIDER_SITE_OTHER): Payer: Private Health Insurance - Indemnity | Admitting: Family Medicine

## 2013-05-31 ENCOUNTER — Encounter: Payer: Self-pay | Admitting: Family Medicine

## 2013-05-31 VITALS — BP 120/84 | HR 72 | Ht 65.0 in | Wt 155.0 lb

## 2013-05-31 DIAGNOSIS — J309 Allergic rhinitis, unspecified: Secondary | ICD-10-CM

## 2013-05-31 DIAGNOSIS — K146 Glossodynia: Secondary | ICD-10-CM

## 2013-05-31 MED ORDER — MAGIC MOUTHWASH: 5.0000 mL | Freq: Four times a day (QID) | ORAL | Status: DC | PRN

## 2013-05-31 NOTE — Patient Instructions (Signed)
Try salt water gargles.  Avoid acidic foods and fruits. If still painful, try the magic mouthwash prescription that was sent to the pharmacy (1 teaspoon, swish and spit 4 times daily if needed).  Mention to your dentist at follow-up (for your surgery) if it isn't improving

## 2013-05-31 NOTE — Progress Notes (Signed)
Chief Complaint  Patient presents with  . Advice Only    issues with tongue. Pt declines flu vaccine.   She is complaining of soreness towards the back of the tongue, feels irritated.  She has noticed it for about a week and a half.  Discomfort is constant, dull.  She drinks lemonade, doesn't seem to make discomfort worse; no changes in diet, no burn or injury.  She has oral surgery scheduled.  She was seen about 2 weeks ago, and discomfort started a few days later (after dentist appt)    She has had some runny nose, watery eyes, some PND.  Nasal steroid is helping.  Has been having a lot of nasal and mouth dryness related to the dry air and use of heat in the house.  Past Medical History  Diagnosis Date  . Allergic rhinitis, cause unspecified   . Menstrual migraine     resolved after getting IUD  . Depression     history; resolved  . Eczema     elbow   Past Surgical History  Procedure Laterality Date  . Bunionectomy  2007    bilateral   History   Social History  . Marital Status: Single    Spouse Name: N/A    Number of Children: N/A  . 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 Smoking status: Never Smoker   . Smokeless tobacco: Never Used  . Alcohol Use: Yes     Comment: 1 glass of wine 1-2 times per month.  . Drug Use: No  . Sexual Activity: Yes    Partners: Male    Birth Control/ Protection: IUD     Comment: Mirena    Other Topics Concern  . Not on file   Social History Narrative  . No narrative on file    Current outpatient prescriptions:flunisolide (NASALIDE) 25 MCG/ACT (0.025%) SOLN, Inhale 2 sprays into the lungs 2 (two) times daily., Disp: 1 Bottle, Rfl: 0;  cetirizine (ZYRTEC) 10 MG tablet, Take 10 mg by mouth daily., Disp: , Rfl: ;  Probiotic Product (PROBIOTIC DAILY PO), Take 1 capsule by mouth daily., Disp: , Rfl:   Allergies  Allergen Reactions  . Codeine Other (See  Comments)    Hyperactivity.   ROS: denies fevers, URI symptoms.  +allergy symptoms.  No other concerns.  PHYSICAL EXAM: BP 120/84  Pulse 72  Ht 5\' 5"  (1.651 m)  Wt 155 lb (70.308 kg)  BMI 25.79 kg/m2 Pleasant female in no distress Nose--mod edema of mucosa, no erythema or purulence OP:  Clear without erythema or lesions.  Tongue--one inflamed taste bud/papule in midportion of right side of tongue.  When palpated she reported this was the area of discomfort Neck: no lymphadenopathy  ASSESSMENT/PLAN:  Tongue pain - Plan: Alum & Mag Hydroxide-Simeth (MAGIC MOUTHWASH) SOLN  Allergic rhinitis, cause unspecified  Salt water gargles, avoid acidic foods/drinks. Trial of magic mouthwash prn  F/u with dentist as scheduled

## 2013-07-04 ENCOUNTER — Telehealth: Payer: Self-pay | Admitting: Internal Medicine

## 2013-07-04 MED ORDER — FLUCONAZOLE 150 MG PO TABS: 150.0000 mg | ORAL_TABLET | Freq: Once | ORAL | Status: DC

## 2013-07-04 NOTE — Telephone Encounter (Signed)
Pt states she just got done having oral surgery done by Dr. Lincoln Brigham and she was put on amoxillin for that and finished it and now she has a yeast infection and would like diflucan called into [facility].

## 2013-09-15 ENCOUNTER — Ambulatory Visit (INDEPENDENT_AMBULATORY_CARE_PROVIDER_SITE_OTHER): Payer: Private Health Insurance - Indemnity | Admitting: Family Medicine

## 2013-09-15 ENCOUNTER — Encounter: Payer: Self-pay | Admitting: Family Medicine

## 2013-09-15 VITALS — BP 100/74 | HR 64 | Wt 156.0 lb

## 2013-09-15 DIAGNOSIS — H6120 Impacted cerumen, unspecified ear: Secondary | ICD-10-CM

## 2013-09-15 NOTE — Progress Notes (Signed)
   Subjective:    Patient ID: [name], female    DOB: 1971/06/21, 43 y.o.   MRN: [id number]  HPI She complains of decreased hearing on the left. No pain, sore throat, cough or congestion.   Review of Systems     Objective:   Physical Exam Alert and in no distress. Right TM and canal are normal with only a small amount of cerumen. Left canal did have cerumen that was easily removed with water.. TM normal.      Assessment & Plan:  Cerumen impaction  Return here as needed.

## 2014-02-13 ENCOUNTER — Other Ambulatory Visit (HOSPITAL_COMMUNITY): Payer: Self-pay | Admitting: Obstetrics and Gynecology

## 2014-02-13 DIAGNOSIS — Z1231 Encounter for screening mammogram for malignant neoplasm of breast: Secondary | ICD-10-CM

## 2014-02-14 ENCOUNTER — Ambulatory Visit (HOSPITAL_COMMUNITY)
Admission: RE | Admit: 2014-02-14 | Discharge: 2014-02-14 | Disposition: A | Discharge status: Home / Self Care | Payer: Private Health Insurance - Indemnity | Source: Ambulatory Visit | Attending: Obstetrics and Gynecology | Admitting: Obstetrics and Gynecology

## 2014-02-14 DIAGNOSIS — Z1231 Encounter for screening mammogram for malignant neoplasm of breast: Secondary | ICD-10-CM | POA: Diagnosis present

## 2014-09-03 ENCOUNTER — Encounter (HOSPITAL_COMMUNITY): Payer: Self-pay | Admitting: *Deleted

## 2014-09-13 ENCOUNTER — Ambulatory Visit (INDEPENDENT_AMBULATORY_CARE_PROVIDER_SITE_OTHER): Payer: Managed Care, Other (non HMO) | Admitting: Family Medicine

## 2014-09-13 VITALS — BP 90/56 | Temp 98.0°F | Resp 15 | Wt 160.0 lb

## 2014-09-13 DIAGNOSIS — R7309 Other abnormal glucose: Secondary | ICD-10-CM

## 2014-09-13 NOTE — Progress Notes (Signed)
   Subjective:    Patient ID: [name], female    DOB: 1971-03-30, 44 y.o.   MRN: [id number]  HPI He is here for consult concerning recent blood work from her gynecologist which did show a hemoglobin A1c of 5.9. She is very concerned over this. She exercises regularly. Does not have hypertension. She does not smoke. There is a family history of diabetes.   Review of Systems     Objective:   Physical Exam Alert and in no distress otherwise not examined       Assessment & Plan:  Elevated hemoglobin A1c  I explained that her A1c is slightly above normal but this could be her normal. There is no history of elevated blood sugars in the past. Discussed this in regard to risk for diabetes. Encouraged her to continue to take good care of herself we can follow-up on this in approximately 6 months. She was comfortable with this approach.

## 2014-09-27 ENCOUNTER — Other Ambulatory Visit: Payer: Self-pay | Admitting: Obstetrics and Gynecology

## 2014-09-27 DIAGNOSIS — Z23 Encounter for immunization: Secondary | ICD-10-CM

## 2014-10-09 ENCOUNTER — Encounter (HOSPITAL_COMMUNITY): Payer: Self-pay | Admitting: Anesthesiology

## 2014-10-09 ENCOUNTER — Ambulatory Visit (HOSPITAL_COMMUNITY): Payer: Managed Care, Other (non HMO) | Admitting: Anesthesiology

## 2014-10-09 ENCOUNTER — Ambulatory Visit (HOSPITAL_COMMUNITY)
Admission: RE | Admit: 2014-10-09 | Discharge: 2014-10-09 | Disposition: A | Discharge status: Home / Self Care | Payer: Managed Care, Other (non HMO) | Source: Ambulatory Visit | Attending: Obstetrics and Gynecology | Admitting: Obstetrics and Gynecology

## 2014-10-09 ENCOUNTER — Encounter (HOSPITAL_COMMUNITY)
Admission: RE | Disposition: A | Discharge status: Home / Self Care | Payer: Self-pay | Source: Ambulatory Visit | Attending: Obstetrics and Gynecology

## 2014-10-09 DIAGNOSIS — D649 Anemia, unspecified: Secondary | ICD-10-CM | POA: Insufficient documentation

## 2014-10-09 DIAGNOSIS — D250 Submucous leiomyoma of uterus: Secondary | ICD-10-CM

## 2014-10-09 DIAGNOSIS — E119 Type 2 diabetes mellitus without complications: Secondary | ICD-10-CM | POA: Insufficient documentation

## 2014-10-09 DIAGNOSIS — F419 Anxiety disorder, unspecified: Secondary | ICD-10-CM | POA: Insufficient documentation

## 2014-10-09 DIAGNOSIS — X58XXXA Exposure to other specified factors, initial encounter: Secondary | ICD-10-CM | POA: Insufficient documentation

## 2014-10-09 DIAGNOSIS — F329 Major depressive disorder, single episode, unspecified: Secondary | ICD-10-CM | POA: Insufficient documentation

## 2014-10-09 DIAGNOSIS — Z30430 Encounter for insertion of intrauterine contraceptive device: Secondary | ICD-10-CM | POA: Diagnosis not present

## 2014-10-09 DIAGNOSIS — N9971 Accidental puncture and laceration of a genitourinary system organ or structure during a genitourinary system procedure: Secondary | ICD-10-CM | POA: Insufficient documentation

## 2014-10-09 DIAGNOSIS — N938 Other specified abnormal uterine and vaginal bleeding: Secondary | ICD-10-CM | POA: Diagnosis present

## 2014-10-09 HISTORY — PX: INTRAUTERINE DEVICE (IUD) INSERTION: SHX5877

## 2014-10-09 HISTORY — DX: Anemia, unspecified: D64.9

## 2014-10-09 HISTORY — PX: DILATATION & CURETTAGE/HYSTEROSCOPY WITH MYOSURE: SHX6511

## 2014-10-09 LAB — PREGNANCY, URINE: Preg Test, Ur: NEGATIVE

## 2014-10-09 LAB — CBC
HCT: 32.6 % — ABNORMAL LOW (ref 36.0–46.0)
Hemoglobin: 10.4 g/dL — ABNORMAL LOW (ref 12.0–15.0)
MCH: 25.3 pg — ABNORMAL LOW (ref 26.0–34.0)
MCHC: 31.9 g/dL (ref 30.0–36.0)
MCV: 79.3 fL (ref 78.0–100.0)
PLATELETS: 186 10*3/uL (ref 150–400)
RBC: 4.11 MIL/uL (ref 3.87–5.11)
RDW: 14 % (ref 11.5–15.5)
WBC: 4.9 10*3/uL (ref 4.0–10.5)

## 2014-10-09 SURGERY — DILATATION & CURETTAGE/HYSTEROSCOPY WITH MYOSURE
Anesthesia: General | Site: Vagina

## 2014-10-09 MED ORDER — DEXAMETHASONE SODIUM PHOSPHATE 4 MG/ML IJ SOLN
INTRAMUSCULAR | Status: DC | PRN
  Administered 2014-10-09: 4 mg via INTRAVENOUS

## 2014-10-09 MED ORDER — FENTANYL CITRATE 0.05 MG/ML IJ SOLN: 25.0000 ug | INTRAMUSCULAR | Status: DC | PRN

## 2014-10-09 MED ORDER — GLYCINE 1.5 % IR SOLN
Status: DC | PRN
  Administered 2014-10-09: 3000 mL

## 2014-10-09 MED ORDER — ONDANSETRON HCL 4 MG/2ML IJ SOLN: 4.0000 mg | Freq: Once | INTRAMUSCULAR | Status: DC | PRN

## 2014-10-09 MED ORDER — LACTATED RINGERS IV SOLN
INTRAVENOUS | Status: DC
  Administered 2014-10-09 (×3): via INTRAVENOUS

## 2014-10-09 MED ORDER — OXYCODONE-ACETAMINOPHEN 5-325 MG PO TABS: 1.0000 | ORAL_TABLET | Freq: Four times a day (QID) | ORAL | Status: DC | PRN

## 2014-10-09 MED ORDER — MIDAZOLAM HCL 5 MG/5ML IJ SOLN
INTRAMUSCULAR | Status: DC | PRN
  Administered 2014-10-09: 2 mg via INTRAVENOUS

## 2014-10-09 MED ORDER — DEXAMETHASONE SODIUM PHOSPHATE 10 MG/ML IJ SOLN
INTRAMUSCULAR | Status: AC
  Filled 2014-10-09: qty 1

## 2014-10-09 MED ORDER — PROPOFOL 10 MG/ML IV BOLUS
INTRAVENOUS | Status: AC
  Filled 2014-10-09: qty 20

## 2014-10-09 MED ORDER — FENTANYL CITRATE 0.05 MG/ML IJ SOLN
INTRAMUSCULAR | Status: DC | PRN
  Administered 2014-10-09: 100 ug via INTRAVENOUS

## 2014-10-09 MED ORDER — SODIUM CHLORIDE 0.9 % IR SOLN
Status: DC | PRN
  Administered 2014-10-09: 3000 mL

## 2014-10-09 MED ORDER — ONDANSETRON HCL 4 MG/2ML IJ SOLN
INTRAMUSCULAR | Status: AC
  Filled 2014-10-09: qty 2

## 2014-10-09 MED ORDER — SCOPOLAMINE 1 MG/3DAYS TD PT72
MEDICATED_PATCH | TRANSDERMAL | Status: AC
  Administered 2014-10-09: 1.5 mg via TRANSDERMAL
  Filled 2014-10-09: qty 1

## 2014-10-09 MED ORDER — FENTANYL CITRATE 0.05 MG/ML IJ SOLN
INTRAMUSCULAR | Status: AC
  Filled 2014-10-09: qty 2

## 2014-10-09 MED ORDER — LIDOCAINE HCL (CARDIAC) 20 MG/ML IV SOLN
INTRAVENOUS | Status: AC
  Filled 2014-10-09: qty 5

## 2014-10-09 MED ORDER — PROPOFOL 10 MG/ML IV BOLUS
INTRAVENOUS | Status: DC | PRN
  Administered 2014-10-09 (×2): 50 mg via INTRAVENOUS
  Administered 2014-10-09: 150 mg via INTRAVENOUS
  Administered 2014-10-09: 50 mg via INTRAVENOUS

## 2014-10-09 MED ORDER — GLYCOPYRROLATE 0.2 MG/ML IJ SOLN
INTRAMUSCULAR | Status: AC
  Filled 2014-10-09: qty 1

## 2014-10-09 MED ORDER — MIDAZOLAM HCL 2 MG/2ML IJ SOLN
INTRAMUSCULAR | Status: AC
  Filled 2014-10-09: qty 2

## 2014-10-09 MED ORDER — KETOROLAC TROMETHAMINE 30 MG/ML IJ SOLN
INTRAMUSCULAR | Status: DC | PRN
  Administered 2014-10-09: 30 mg via INTRAVENOUS
  Administered 2014-10-09: 30 mg via INTRAMUSCULAR

## 2014-10-09 MED ORDER — ONDANSETRON HCL 4 MG/2ML IJ SOLN
INTRAMUSCULAR | Status: DC | PRN
  Administered 2014-10-09: 4 mg via INTRAVENOUS

## 2014-10-09 MED ORDER — LIDOCAINE HCL (CARDIAC) 20 MG/ML IV SOLN
INTRAVENOUS | Status: DC | PRN
  Administered 2014-10-09: 60 mg via INTRAVENOUS

## 2014-10-09 MED ORDER — SCOPOLAMINE 1 MG/3DAYS TD PT72
1.0000 | MEDICATED_PATCH | Freq: Once | TRANSDERMAL | Status: DC
  Administered 2014-10-09: 1.5 mg via TRANSDERMAL

## 2014-10-09 MED ORDER — DEXAMETHASONE SODIUM PHOSPHATE 4 MG/ML IJ SOLN
INTRAMUSCULAR | Status: AC
  Filled 2014-10-09: qty 1

## 2014-10-09 MED ORDER — KETOROLAC TROMETHAMINE 30 MG/ML IJ SOLN
INTRAMUSCULAR | Status: AC
  Filled 2014-10-09: qty 2

## 2014-10-09 SURGICAL SUPPLY — 26 items
CANISTER SUCT 3000ML (MISCELLANEOUS) ×3 IMPLANT
CATH ROBINSON RED A/P 16FR (CATHETERS) ×3 IMPLANT
CLOTH BEACON ORANGE TIMEOUT ST (SAFETY) ×3 IMPLANT
CONTAINER PREFILL 10% NBF 60ML (FORM) ×6 IMPLANT
DEVICE MYOSURE CLASSIC (MISCELLANEOUS) IMPLANT
DEVICE MYOSURE LITE (MISCELLANEOUS) IMPLANT
ELECT REM PT RETURN 9FT ADLT (ELECTROSURGICAL) ×3
ELECTRODE REM PT RTRN 9FT ADLT (ELECTROSURGICAL) ×1 IMPLANT
FILTER ARTHROSCOPY CONVERTOR (FILTER) ×3 IMPLANT
GLOVE BIOGEL PI IND STRL 7.0 (GLOVE) ×2 IMPLANT
GLOVE BIOGEL PI INDICATOR 7.0 (GLOVE) ×4
GLOVE ECLIPSE 6.5 STRL STRAW (GLOVE) ×3 IMPLANT
GLOVE INDICATOR 7.0 STRL GRN (GLOVE) ×3 IMPLANT
GOWN STRL REUS W/TWL LRG LVL3 (GOWN DISPOSABLE) ×6 IMPLANT
MIRENA ×3 IMPLANT
MYOSURE XL FIBROID REM (MISCELLANEOUS)
NEEDLE SPNL 22GX3.5 QUINCKE BK (NEEDLE) ×3 IMPLANT
PACK VAGINAL MINOR WOMEN LF (CUSTOM PROCEDURE TRAY) ×3 IMPLANT
PAD OB MATERNITY 4.3X12.25 (PERSONAL CARE ITEMS) ×3 IMPLANT
PAD PREP 24X48 CUFFED NSTRL (MISCELLANEOUS) ×3 IMPLANT
SEAL ROD LENS SCOPE MYOSURE (ABLATOR) ×3 IMPLANT
SYSTEM TISS REMOVAL MYSR XL RM (MISCELLANEOUS) IMPLANT
TOWEL OR 17X24 6PK STRL BLUE (TOWEL DISPOSABLE) ×6 IMPLANT
TUBING AQUILEX INFLOW (TUBING) ×3 IMPLANT
TUBING AQUILEX OUTFLOW (TUBING) ×3 IMPLANT
WATER STERILE IRR 1000ML POUR (IV SOLUTION) ×3 IMPLANT

## 2014-10-09 NOTE — Anesthesia Postprocedure Evaluation (Signed)
  Anesthesia Post-op Note  Patient: [name]  Procedure(s) Performed: Procedure(s) (LRB): DILATATION & CURETTAGE/HYSTEROSCOPY WITH RESECTION OF SUBMUCOSAL FIBROIDS AND REPAIR CERVICAL LACERATION (N/A) Mirena INTRAUTERINE DEVICE (IUD) INSERTION (N/A)  Patient Location: PACU  Anesthesia Type: General  Level of Consciousness: awake and alert   Airway and Oxygen Therapy: Patient Spontanous Breathing  Post-op Pain: mild  Post-op Assessment: Post-op Vital signs reviewed, Patient's Cardiovascular Status Stable, Respiratory Function Stable, Patent Airway and No signs of Nausea or vomiting  Last Vitals:  Filed Vitals:   10/09/14 1515  BP: 113/66  Pulse: 84  Temp:   Resp: 13    Post-op Vital Signs: stable   Complications: No apparent anesthesia complications

## 2014-10-09 NOTE — H&P (Signed)
[name] is an 44 y.o. female.G0 SBF s/p removal of Mirena IUD due to malposition and irregular vaginal bleeding who presents for reinsertion of Mirena IUD if possible as well ads dx hysteroscopy, resection of endometrial masses, D&C due to sono hysterogram finding of multiple endometrial polyp and poss SM fibroid.  Pertinent Gynecological History: Menses: irreg Bleeding: dysfunctional uterine bleeding Contraception: none DES exposure: denies Blood transfusions: none Sexually transmitted diseases: no past history Previous GYN Procedures: none  Last mammogram: normal Date: 2015 Last pap: normal Date: 2016 OB History: G0, P0   Menstrual History: Menarche age: 49 Patient's last menstrual period was 08/29/2014 (approximate).    Past Medical History  Diagnosis Date  . Allergic rhinitis, cause unspecified   . Menstrual migraine     resolved after getting IUD  . Eczema     elbow  . Diabetes mellitus without complication     prediabetic - no meds, recently dx, A1C= 5.9  . Depression     history; resolved  . Anemia     Past Surgical History  Procedure Laterality Date  . Bunionectomy  2007    bilateral  . Wisdom tooth extraction      Family History  Problem Relation Age of Onset  . Hypertension Mother   . Hypertension Sister     Social History:  reports that she has never smoked. She has never used smokeless tobacco. She reports that she drinks alcohol. She reports that she does not use illicit drugs.  Allergies:  Allergies  Allergen Reactions  . Codeine Other (See Comments)    Hyperactivity.    No prescriptions prior to admission    Review of Systems  All other systems reviewed and are negative.   Height 5\' 5"  (1.651 m), weight 72.576 kg (160 lb), last menstrual period 08/29/2014. Physical Exam  Constitutional: She is oriented to person, place, and time. She appears well-developed and well-nourished.  HENT:  Head: Atraumatic.  Eyes: EOM are normal.   Neck: Neck supple.  Cardiovascular: Regular rhythm.   Respiratory: Breath sounds normal.  GI: Soft.  Genitourinary: Vagina normal.  Musculoskeletal: She exhibits edema.  Neurological: She is alert and oriented to person, place, and time.  Skin: Skin is warm and dry.  Psychiatric: She has a normal mood and affect.  vulva nl Cervix nl Adnexa nl Uterus sl enlarged  No results found for this or any previous visit (from the past 24 hour(s)).  No results found.  Assessment/Plan: Endometrial masses( endom polyps, poss SM fibroid) DUB Mirena IUD insertion P) dx hysteroscopy, hysteroscopic resection of endom masses with myosure, D&C, Mirena IUD insertion. Risk of procedure explained: infection, bleeding, injury to surrounding organ structures, fluid overload, thermal injury, uterine perforation( 07/998) and its risk, poss not able to insert Mirena IUD [name] A 10/09/2014, 2:01 AM

## 2014-10-09 NOTE — Anesthesia Preprocedure Evaluation (Signed)
Anesthesia Evaluation  Patient identified by MRN, date of birth, ID band Patient awake    Reviewed: Allergy & Precautions, NPO status , Patient's Chart, lab work & pertinent test results  History of Anesthesia Complications Negative for: history of anesthetic complications  Airway Mallampati: II  TM Distance: >3 FB Neck ROM: Full    Dental no notable dental hx. (+) Dental Advisory Given   Pulmonary neg pulmonary ROS,  breath sounds clear to auscultation  Pulmonary exam normal       Cardiovascular negative cardio ROS  Rhythm:Regular Rate:Normal     Neuro/Psych  Headaches, PSYCHIATRIC DISORDERS Anxiety Depression    GI/Hepatic negative GI ROS, Neg liver ROS,   Endo/Other  diabetes  Renal/GU negative Renal ROS  negative genitourinary   Musculoskeletal negative musculoskeletal ROS (+)   Abdominal   Peds negative pediatric ROS (+)  Hematology  (+) anemia ,   Anesthesia Other Findings   Reproductive/Obstetrics negative OB ROS                             Anesthesia Physical Anesthesia Plan  ASA: II  Anesthesia Plan: General   Post-op Pain Management:    Induction: Intravenous  Airway Management Planned: LMA  Additional Equipment:   Intra-op Plan:   Post-operative Plan: Extubation in OR  Informed Consent: I have reviewed the patients History and Physical, chart, labs and discussed the procedure including the risks, benefits and alternatives for the proposed anesthesia with the patient or authorized representative who has indicated his/her understanding and acceptance.   Dental advisory given  Plan Discussed with: CRNA  Anesthesia Plan Comments:         Anesthesia Quick Evaluation

## 2014-10-09 NOTE — Transfer of Care (Signed)
Immediate Anesthesia Transfer of Care Note  Patient: [name]  Procedure(s) Performed: Procedure(s): DILATATION & CURETTAGE/HYSTEROSCOPY WITH RESECTION OF SUBMUCOSAL FIBROIDS AND REPAIR CERVICAL LACERATION (N/A) Mirena INTRAUTERINE DEVICE (IUD) INSERTION (N/A)  Patient Location: PACU  Anesthesia Type:General  Level of Consciousness: awake, alert  and oriented  Airway & Oxygen Therapy: Patient Spontanous Breathing and Patient connected to nasal cannula oxygen  Post-op Assessment: Report given to RN and Post -op Vital signs reviewed and stable  Post vital signs: Reviewed and stable  Last Vitals:  Filed Vitals:   10/09/14 1224  BP: 134/84  Pulse:   Temp:   Resp:     Complications: No apparent anesthesia complications

## 2014-10-09 NOTE — Brief Op Note (Signed)
10/09/2014  2:47 PM  PATIENT:  [name]  44 y.o. female  PRE-OPERATIVE DIAGNOSIS:  Endometrial Masses, Desires  Mirena IUD, DUB  POST-OPERATIVE DIAGNOSIS:  endometrial masses, Mirena IUD insertion, DUB  PROCEDURE:  Diagnostic hysteroscopy, hysteroscopic resection of submucosal fibroids, dilation and curettage, Mirena IUD insertion  SURGEON:  Surgeon(s) and Role:    * Corporate treasurer, MD - Primary  PHYSICIAN ASSISTANT:   ASSISTANTS: none   ANESTHESIA:   general Findings: left lateral wall SM fibroids x2, right tubal ostia seen, left tubal ostia above fibroids EBL:  Total I/O In: 1000 [I.V.:1000] Out: 150 [Urine:100; Blood:50]  BLOOD ADMINISTERED:none  DRAINS: none   LOCAL MEDICATIONS USED:  NONE  SPECIMEN:  Source of Specimen:  EMC, fibroid resection  DISPOSITION OF SPECIMEN:  PATHOLOGY  COUNTS:  YES  TOURNIQUET:  * No tourniquets in log *  DICTATION: .Other Dictation: Dictation Number [id number]  PLAN OF CARE: Discharge to home after PACU  PATIENT DISPOSITION:  PACU - hemodynamically stable.   Delay start of Pharmacological VTE agent (>24hrs) due to surgical blood loss or risk of bleeding: no

## 2014-10-10 ENCOUNTER — Encounter (HOSPITAL_COMMUNITY): Payer: Self-pay | Admitting: Obstetrics and Gynecology

## 2014-10-10 NOTE — Op Note (Signed)
[name], [name]                ACCOUNT NO.:  [id number]  MEDICAL RECORD NO.:  [id number]  LOCATION:  [facility]                          FACILITY:  [facility]  PHYSICIAN:  Servando Salina, M.D.DATE OF BIRTH:  1971-06-26  DATE OF PROCEDURE:  10/09/2014 DATE OF DISCHARGE:  10/09/2014                              OPERATIVE REPORT   PREOPERATIVE DIAGNOSES:  Endometrial masses, dysfunctional uterine bleeding, desires intrauterine device insertion.  PROCEDURES:  Diagnostic hysteroscopy, hysteroscopic resection of submucosal fibroids, Mirena IUD insertion, dilation and curettage, repair of cervical laceration.  POSTOPERATIVE DIAGNOSES:  Submucosal fibroids,  Mirena IUD placement, cervical laceration.  ANESTHESIA:  General.  SURGEON:  Servando Salina, MD.  ASSISTANT:  None.  DESCRIPTION OF PROCEDURE:  Under adequate general anesthesia, the patient was placed in the dorsal lithotomy position.  She was sterilely prepped and draped in the usual fashion.  The bladder was catheterized for moderate amount of urine.  Examination under anesthesia revealed an anteflexed uterus.  No adnexal masses could be appreciated.  A bivalve speculum was placed in vagina.  A single-tooth tenaculum was placed on the anterior lip of the cervix.  The uterus was then sounded to 8 cm. The cervix was then serially dilated up to the #19 [facility] dilator in anticipation of the use of MyoSure apparatus.  In an attempt to cross the internal os with MyoSure apparatus, it was then noted that there was a ridge at the inferior aspect of the internal os.  The MyoSure apparatus was removed and the cervix was then further dilated to #25 [facility] dilator.  Despite the dilatation and despite being able to visualize the internal cervical os, the apparatus could not traverse internal os.  At that point, the hysteroscope was then switched to a traditional hysteroscopy instrument, greater than 2.9 mm diameter. Using that hysteroscope  diagnostic after it was being primed with the use of glycine, the cavity was traversed, and on the left lateral wall were 2 adjacent submucosal fibroids.  The tubal ostia on the right could be seen well.  The one on the left was behind, superiorly obtained above the 2 fibroids.  The diagnostic hysteroscope was removed.  The cervix was then serially dilated up to a #29 [employer] dilator.  It would not except the #31 [facility] dilator.  The resectoscope after much attempt and success using rotating method,I was able to finally traverse the internal os. The fibroids were resected using a single loop.  The pieces were removed while the resectoscope was still in place.  Once the fibroids had been leveled to the cavity wall, the resectoscope was removed.  The  cavity was gently curetted.  The uterus was gently sounded to 8 cm.  The Mirena IUD was then inserted in the usual fashion.  The string was cut to about 2 cm length.  During the course of the procedure in order to try to traverse the internal os, the single-tooth tenaculum that had been placed on the anterior lip on several different locations anteriorly and posteriorly resulted in anterior cervical attachment being torn, that required interrupted 3-0 chromic sutures being placed. Once that was done, all instruments were then removed from the vagina.  SPECIMEN:  Labeled fibroid resection, endometrial curetting was sent to Pathology.  ESTIMATED BLOOD LOSS:  50 mL.  FLUID DEFICIT:  1 L.  URINE OUTPUT:  100 mL.  INTRAOPERATIVE FLUID:  1 L.  Sponge and instrument counts x2 was correct.  COMPLICATION:  None.  The patient tolerated the procedure well, was transferred to recovery in stable condition.     Servando Salina, M.D.     Carmine/MEDQ  D:  10/09/2014  T:  10/10/2014  Job:  [id number]

## 2014-12-25 ENCOUNTER — Telehealth: Payer: Self-pay | Admitting: Family Medicine

## 2014-12-25 NOTE — Telephone Encounter (Signed)
I can follow-up on the vitamin D when she comes in for the checkup

## 2014-12-25 NOTE — Telephone Encounter (Signed)
Patient informed and verbalized understanding

## 2014-12-25 NOTE — Telephone Encounter (Signed)
Pt planning to follow up with Dr Redmond School regarding prediabetes in September as she was instructed however pt wants to know if she was to see her OBGYN about Vit D or plan to see Dr Redmond School about this as well. And when she should see someone about Vit D?

## 2015-04-04 ENCOUNTER — Encounter: Payer: Self-pay | Admitting: Family Medicine

## 2015-04-04 ENCOUNTER — Ambulatory Visit (INDEPENDENT_AMBULATORY_CARE_PROVIDER_SITE_OTHER): Payer: Managed Care, Other (non HMO) | Admitting: Family Medicine

## 2015-04-04 VITALS — BP 98/68 | HR 76 | Resp 12 | Wt 160.4 lb

## 2015-04-04 DIAGNOSIS — M6740 Ganglion, unspecified site: Secondary | ICD-10-CM

## 2015-04-04 DIAGNOSIS — R7309 Other abnormal glucose: Secondary | ICD-10-CM

## 2015-04-04 NOTE — Progress Notes (Signed)
   Subjective:    Patient ID: [name], female    DOB: 1971-05-26, 44 y.o.   MRN: [id number]  HPI She is here for a recheck. She did have an hemoglobin A1c of 5.9 and was asked to return here for follow-up visit. She has had no urinary or polydipsia. She also has a lesion on her right foot she would like evaluated. She has had previous toe surgery.   Review of Systems     Objective:   Physical Exam alert and in no distress. Hemoglobin A1c is 5.7. Exam of the right great toe does show a 1 cm round smooth movable lesion over the dorsum of the  toe.      Assessment & Plan:  Ganglion  Elevated hemoglobin A1c  I explained that her A1c is going down but she potentially is at risk for future trouble with diabetes. Encouraged her to remain physically active and keep her weight under control. No therapy for the ganglion since is not causing any trouble.

## 2015-05-07 ENCOUNTER — Encounter: Payer: Self-pay | Admitting: Family Medicine

## 2015-05-07 ENCOUNTER — Ambulatory Visit (INDEPENDENT_AMBULATORY_CARE_PROVIDER_SITE_OTHER): Payer: Managed Care, Other (non HMO) | Admitting: Family Medicine

## 2015-05-07 VITALS — BP 120/70 | HR 60 | Temp 98.0°F | Wt 161.4 lb

## 2015-05-07 DIAGNOSIS — J0140 Acute pansinusitis, unspecified: Secondary | ICD-10-CM

## 2015-05-07 MED ORDER — AMOXICILLIN 875 MG PO TABS: 875.0000 mg | ORAL_TABLET | Freq: Two times a day (BID) | ORAL | Status: DC

## 2015-05-07 NOTE — Patient Instructions (Addendum)
Take a good multivitamin and vitamin D 1000 IU daily. Hydrate. Use saline nasal spray twice daily. Continue to use cold medication and tylenol and ibuprofen as needed for pain and low grade fever. You can try a humidifier.    Sinusitis, Adult Sinusitis is redness, soreness, and inflammation of the paranasal sinuses. Paranasal sinuses are air pockets within the bones of your face. They are located beneath your eyes, in the middle of your forehead, and above your eyes. In healthy paranasal sinuses, mucus is able to drain out, and air is able to circulate through them by way of your nose. However, when your paranasal sinuses are inflamed, mucus and air can become trapped. This can allow bacteria and other germs to grow and cause infection. Sinusitis can develop quickly and last only a short time (acute) or continue over a long period (chronic). Sinusitis that lasts for more than 12 weeks is considered chronic. CAUSES Causes of sinusitis include:  Allergies.  Structural abnormalities, such as displacement of the cartilage that separates your nostrils (deviated septum), which can decrease the air flow through your nose and sinuses and affect sinus drainage.  Functional abnormalities, such as when the small hairs (cilia) that line your sinuses and help remove mucus do not work properly or are not present. SIGNS AND SYMPTOMS Symptoms of acute and chronic sinusitis are the same. The primary symptoms are pain and pressure around the affected sinuses. Other symptoms include:  Upper toothache.  Earache.  Headache.  Bad breath.  Decreased sense of smell and taste.  A cough, which worsens when you are lying flat.  Fatigue.  Fever.  Thick drainage from your nose, which often is green and may contain pus (purulent).  Swelling and warmth over the affected sinuses. DIAGNOSIS Your health care provider will perform a physical exam. During your exam, your health care provider may perform any of the  following to help determine if you have acute sinusitis or chronic sinusitis:  Look in your nose for signs of abnormal growths in your nostrils (nasal polyps).  Tap over the affected sinus to check for signs of infection.  View the inside of your sinuses using an imaging device that has a light attached (endoscope). If your health care provider suspects that you have chronic sinusitis, one or more of the following tests may be recommended:  Allergy tests.  Nasal culture. A sample of mucus is taken from your nose, sent to a lab, and screened for bacteria.  Nasal cytology. A sample of mucus is taken from your nose and examined by your health care provider to determine if your sinusitis is related to an allergy. TREATMENT Most cases of acute sinusitis are related to a viral infection and will resolve on their own within 10 days. Sometimes, medicines are prescribed to help relieve symptoms of both acute and chronic sinusitis. These may include pain medicines, decongestants, nasal steroid sprays, or saline sprays. However, for sinusitis related to a bacterial infection, your health care provider will prescribe antibiotic medicines. These are medicines that will help kill the bacteria causing the infection. Rarely, sinusitis is caused by a fungal infection. In these cases, your health care provider will prescribe antifungal medicine. For some cases of chronic sinusitis, surgery is needed. Generally, these are cases in which sinusitis recurs more than 3 times per year, despite other treatments. HOME CARE INSTRUCTIONS  Drink plenty of water. Water helps thin the mucus so your sinuses can drain more easily.  Use a humidifier.  Inhale steam  3-4 times a day (for example, sit in the bathroom with the shower running).  Apply a warm, moist washcloth to your face 3-4 times a day, or as directed by your health care provider.  Use saline nasal sprays to help moisten and clean your sinuses.  Take  medicines only as directed by your health care provider.  If you were prescribed either an antibiotic or antifungal medicine, finish it all even if you start to feel better. SEEK IMMEDIATE MEDICAL CARE IF:  You have increasing pain or severe headaches.  You have nausea, vomiting, or drowsiness.  You have swelling around your face.  You have vision problems.  You have a stiff neck.  You have difficulty breathing.   This information is not intended to replace advice given to you by your health care provider. Make sure you discuss any questions you have with your health care provider.   Document Released: 06/22/2005 Document Revised: 07/13/2014 Document Reviewed: 07/07/2011 Elsevier Interactive Patient Education [employer].

## 2015-05-07 NOTE — Progress Notes (Signed)
   Subjective:    Patient ID: [name], female    DOB: 03/06/1971, 44 y.o.   MRN: [id number]  HPI Chief Complaint  Patient presents with  . sinus infection    sinus infection-last couple days. sinus pain, fatigue, headache. running nose- sometimes bleeds when blowing and stuffy nose. coughing more   She is here for an acute visit. Complains of a 3 day history of sinus pressure, drainage, congestion and fatigue. Reports abrupt onset of symptoms. States this morning she had purulent blood-tinged drainage from her nose. Denies fever, chills, cough, sore throat, ear pain. States she is currently training for a marathon. She does not smoke, no sick contacts. She states she usually gets one bad sinus infection per year and she knows her body and knows this is a sinus infection. She has been taking DayQuil during the day and NyQuil at night. She has underlying allergies and has been taking daily allergy medication. She reports feeling slightly improved today compared to yesterday.   Reviewed allergies, medications, past medical and social history.  Review of Systems Pertinent positives and negatives in the history of present illness.    Objective:   Physical Exam  Alert and in no distress. Marked ethmoid and maxillary sinus tenderness. Nasal mucosa edema. Tympanic membranes and canals are normal. Pharyngeal area is normal. Neck is supple without adenopathy. Cardiac exam shows a regular sinus rhythm without murmurs or gallops. Lungs are clear to auscultation.      Assessment & Plan:  Acute pansinusitis, recurrence not specified  Discussed that she should continue treating her symptoms and that I will call in an antibiotic. Advised her to wait 2-3 more days since she is currently feeling slightly improved and see if she continues to get better on her own. Discussed that she should start taking the antibiotic if she gets worse or if she continues to improve and then gets worse again. Recommend  she treat her underlying allergies and let me know if she is not back to baseline after finishing the antibiotic, if she indeed has to take this. Suggest trying a humidifier and saline nasal spray for nose bleeds and congestion.

## 2015-06-04 ENCOUNTER — Telehealth: Payer: Self-pay

## 2015-06-04 NOTE — Telephone Encounter (Signed)
Refer her to a foot surgeon and let her know that he would be the best one to tell how long it would take

## 2015-06-04 NOTE — Telephone Encounter (Signed)
Has cyst on R foot, says you looked at it before, and it doesn't bother her too bad but she can feel the pressure on it and is aggrivating. She wants to know how long it would take to recover after getting it removed since she likes to run.

## 2015-06-06 NOTE — Telephone Encounter (Signed)
I have sent referral to [facility] ortho left message for pt to inform her they would contact her for appointment

## 2015-06-12 ENCOUNTER — Other Ambulatory Visit: Payer: Self-pay | Admitting: Orthopedic Surgery

## 2015-06-24 ENCOUNTER — Encounter (HOSPITAL_BASED_OUTPATIENT_CLINIC_OR_DEPARTMENT_OTHER): Payer: Self-pay | Admitting: *Deleted

## 2015-06-26 ENCOUNTER — Ambulatory Visit: Payer: Managed Care, Other (non HMO) | Attending: Orthopedic Surgery | Admitting: Physical Therapy

## 2015-06-26 DIAGNOSIS — M6289 Other specified disorders of muscle: Secondary | ICD-10-CM | POA: Insufficient documentation

## 2015-06-26 DIAGNOSIS — R29898 Other symptoms and signs involving the musculoskeletal system: Secondary | ICD-10-CM

## 2015-06-26 DIAGNOSIS — M25561 Pain in right knee: Secondary | ICD-10-CM | POA: Insufficient documentation

## 2015-06-26 NOTE — Patient Instructions (Signed)
   Kristoffer Leamon PT, DPT, LAT, ATC  [facility] Phone: [phone]     

## 2015-06-26 NOTE — Therapy (Addendum)
[place], [place], [address] Phone: [phone]   Fax:  [phone]  Physical Therapy Evaluation / discharge note  Patient Details  Name: JACKILYN UMPHLETT MRN: [id number] Date of Birth: 1971-05-19 Referring Provider: Dr. Doran Durand  Encounter Date: 06/26/2015      PT End of Session - 06/26/15 1455    Visit Number 1   Number of Visits 6   Date for PT Re-Evaluation 08/21/15   PT Start Time 1330   PT Stop Time 1415   PT Time Calculation (min) 45 min   Activity Tolerance Patient tolerated treatment well   Behavior During Therapy [facility] for tasks assessed/performed      Past Medical History  Diagnosis Date  . Allergic rhinitis, cause unspecified   . Menstrual migraine     resolved after getting IUD  . Eczema     elbow  . Depression     history; resolved  . Anemia     Past Surgical History  Procedure Laterality Date  . Bunionectomy  2007    bilateral  . Wisdom tooth extraction    . Dilatation & curettage/hysteroscopy with myosure N/A 10/09/2014    Procedure: DILATATION & CURETTAGE/HYSTEROSCOPY WITH RESECTION OF SUBMUCOSAL FIBROIDS AND REPAIR CERVICAL LACERATION;  Surgeon: Servando Salina, MD;  Location: [facility];  Service: Gynecology;  Laterality: N/A;  . Intrauterine device (iud) insertion N/A 10/09/2014    Procedure: Mirena INTRAUTERINE DEVICE (IUD) INSERTION;  Surgeon: Servando Salina, MD;  Location: [facility];  Service: Gynecology;  Laterality: N/A;    There were no vitals filed for this visit.  Visit Diagnosis:  Right knee pain - Plan: PT plan of care cert/re-cert  Weakness of both hips - Plan: PT plan of care cert/re-cert      Subjective Assessment - 06/26/15 1338    Subjective pt is a 44 y.o F with CC of R knee pain that has been going on for the last 2-3 weeks. She reports it only occurs after sitting for a couple of hours, she reports her job requires prolonged sitting. She reports having  a  hx of knee problems when she was younger.  she denies any referral of pain.    Limitations Sitting  prolonged sitting   How long can you sit comfortably? 1 hour   How long can you stand comfortably? unlimited   How long can you walk comfortably? unlimited   Diagnostic tests no imaging    Patient Stated Goals to get some exercises to calm down the pain.    Currently in Pain? Yes   Pain Score 0-No pain  after prlonged sititng 5/10   Pain Location Knee   Pain Orientation Right   Pain Descriptors / Indicators Aching   Pain Type Chronic pain   Pain Onset More than a month ago   Pain Frequency Intermittent   Aggravating Factors  prolonged sitting,    Pain Relieving Factors get up and walk around.             [facility] PT Assessment - 06/26/15 1343    Assessment   Medical Diagnosis R knee pain   Referring Provider Dr. Doran Durand   Onset Date/Surgical Date --  2-3 weeks ago   Hand Dominance Right   Next MD Visit 07/04/2015   Prior Therapy yes   Precautions   Precautions None   Restrictions   Weight Bearing Restrictions No   Balance Screen   Has the patient fallen in the past 6  months No   Has the patient had a decrease in activity level because of a fear of falling?  No   Is the patient reluctant to leave their home because of a fear of falling?  No   Home Environment   Living Environment Private residence   Living Arrangements Spouse/significant other   Available Help at Discharge Available PRN/intermittently;Available 24 hours/day   Type of Home Apartment   Home Access Stairs to enter   Entrance Stairs-Number of Steps 10   Entrance Stairs-Rails Right   Home Layout One level   Prior Function   Level of Independence Independent;Independent with basic ADLs   Cognition   Overall Cognitive Status Within Functional Limits for tasks assessed   Observation/Other Assessments   Focus on Therapeutic Outcomes (FOTO)  1% limited   ROM / Strength   AROM / PROM / Strength AROM;Strength    AROM   AROM Assessment Site Knee   Right/Left Knee Right;Left   Strength   Strength Assessment Site Knee;Hip   Right/Left Hip Right;Left   Right Hip Flexion 5/5   Right Hip Extension 4-/5   Right Hip ABduction 4-/5   Right Hip ADduction 5/5   Left Hip Flexion 4+/5   Left Hip Extension 4-/5   Left Hip ABduction 4-/5   Left Hip ADduction 5/5   Right/Left Knee Left;Right   Right Knee Flexion 5/5   Right Knee Extension 5/5   Left Knee Flexion 5/5   Left Knee Extension 5/5   Palpation   Palpation comment she r   Special Tests    Special Tests Plica Tests;Q-Angel (Patellofemoral Angel);Knee Special Tests;Hip Special Tests   Hip Special Tests  Thomas Test   Knee Special tests  Step-up/Step Down Test;Patellofemoral Apprehension Test;Lateral Pull Sign   Q-Angle (Patellofemoral Angle) Left;Right   Plica Tests Hughston's Plica Test   Lateral Pull Sign    Findings Negative   Patellofemoral Apprehension Test    Findings Negative   Step-up/Step Down    Findings Positive   Side  --  bil   Q-Angle (Patellofemoral Angle)- Right   Angle in degrees-Right  10   Q-Angle (Patellofemoral Angle)- Left   Angle in degrees-Left 10   Hughston's Plica Test   Findings Negative                           PT Education - 06/26/15 1455    Education provided Yes   Education Details evaluation findings, POC, goals, HEP   Person(s) Educated Patient   Methods Explanation   Comprehension Verbalized understanding          PT Short Term Goals - 06/26/15 1633    PT SHORT TERM GOAL #1   Title STG=LTG           PT Long Term Goals - 06/26/15 1633    PT LONG TERM GOAL #1   Title pt will be I with all HEP as of last visit (08/21/2015)   Time 6   Period Weeks   Status New   PT LONG TERM GOAL #2   Title pt will demonstrate >/= 4/5 strength with bil hip abductors/ extensors to assist with decreased medial collapse of bil knees during step down tests (08/21/2015)   Time 6    Period Weeks   Status New   PT LONG TERM GOAL #3   Title pt wil be able to sit for >/=  4 hours with 0/10 without having to  get up or change positions to assist with work related tasks (08/21/2015)   Time [place] - 06/26/15 1456    Clinical Impression Statement Georgette presents to OPPT with CC of R knee pain for the last 2-3 weeks after sitting for long periods of time. She demonstrates functional knee and hip mobility. MMT revealed weakness of the hip abductors and extensors bil. special testing was postive for step down testing, clarks gring testing, and  thomas test for tightness. palpation revealed some lateral tilt of the R patella compared bil. She would benefti from physical therapy to decrease pain and return to PLOF by addressing the impairments listed.   pt reported she plans to call after and get scheduled after the new year   Pt will benefit from skilled therapeutic intervention in order to improve on the following deficits Pain;Improper body mechanics;Postural dysfunction;Increased muscle spasms;Decreased strength   Rehab Potential Good   PT Frequency 1x / week   PT Duration 6 weeks   PT Treatment/Interventions ADLs/Self Care Home Management;Cryotherapy;Electrical Stimulation;Iontophoresis 53m/ml Dexamethasone;Moist Heat;Therapeutic exercise;Therapeutic activities;Taping;Dry needling;Passive range of motion;Manual techniques;Patient/family education;Ultrasound   PT Next Visit Plan assess response HEP, hip strengthening.    Consulted and Agree with Plan of Care Patient         Problem List Patient Active Problem List   Diagnosis Date Noted  . Seasonal and perennial allergic rhinitis 02/04/2011   KStarr LakePT, DPT, LAT, ATC  06/26/2015  4:54 PM     [facility] [address]Phone: [phone]  Fax:  [phone] Name: SMARQUITA LIASMRN: [id number] of Birth: 6August 11, 1972  PHYSICAL THERAPY DISCHARGE SUMMARY  Visits from Start of Care: 1  Current functional level related to goals / functional outcomes: See goals   Remaining deficits: Unknown due to patient not returning.    Education / Equipment: HEP  Plan:                                                    Patient goals were not met. Patient is being discharged due to not returning since the last visit.  ?????        Sloan Takagi PT, DPT, LAT, ATC  09/11/2015  3:27 PM

## 2015-07-04 ENCOUNTER — Ambulatory Visit (HOSPITAL_BASED_OUTPATIENT_CLINIC_OR_DEPARTMENT_OTHER)
Admission: RE | Admit: 2015-07-04 | Discharge: 2015-07-04 | Disposition: A | Discharge status: Home / Self Care | Payer: Managed Care, Other (non HMO) | Source: Ambulatory Visit | Attending: Orthopedic Surgery | Admitting: Orthopedic Surgery

## 2015-07-04 ENCOUNTER — Encounter (HOSPITAL_BASED_OUTPATIENT_CLINIC_OR_DEPARTMENT_OTHER)
Admission: RE | Disposition: A | Discharge status: Home / Self Care | Payer: Self-pay | Source: Ambulatory Visit | Attending: Orthopedic Surgery

## 2015-07-04 ENCOUNTER — Encounter (HOSPITAL_BASED_OUTPATIENT_CLINIC_OR_DEPARTMENT_OTHER): Payer: Self-pay | Admitting: *Deleted

## 2015-07-04 ENCOUNTER — Ambulatory Visit (HOSPITAL_BASED_OUTPATIENT_CLINIC_OR_DEPARTMENT_OTHER): Payer: Managed Care, Other (non HMO) | Admitting: Anesthesiology

## 2015-07-04 DIAGNOSIS — Y831 Surgical operation with implant of artificial internal device as the cause of abnormal reaction of the patient, or of later complication, without mention of misadventure at the time of the procedure: Secondary | ICD-10-CM | POA: Diagnosis not present

## 2015-07-04 DIAGNOSIS — M79671 Pain in right foot: Secondary | ICD-10-CM | POA: Diagnosis present

## 2015-07-04 DIAGNOSIS — T8484XA Pain due to internal orthopedic prosthetic devices, implants and grafts, initial encounter: Secondary | ICD-10-CM | POA: Insufficient documentation

## 2015-07-04 DIAGNOSIS — T8484XD Pain due to internal orthopedic prosthetic devices, implants and grafts, subsequent encounter: Secondary | ICD-10-CM

## 2015-07-04 DIAGNOSIS — D1723 Benign lipomatous neoplasm of skin and subcutaneous tissue of right leg: Secondary | ICD-10-CM | POA: Insufficient documentation

## 2015-07-04 HISTORY — PX: MASS EXCISION: SHX2000

## 2015-07-04 HISTORY — PX: HARDWARE REMOVAL: SHX979

## 2015-07-04 SURGERY — EXCISION MASS
Anesthesia: General | Site: Foot | Laterality: Right

## 2015-07-04 MED ORDER — ONDANSETRON HCL 4 MG/2ML IJ SOLN
INTRAMUSCULAR | Status: DC | PRN
  Administered 2015-07-04: 4 mg via INTRAVENOUS

## 2015-07-04 MED ORDER — FENTANYL CITRATE (PF) 100 MCG/2ML IJ SOLN
INTRAMUSCULAR | Status: AC
Start: 2015-07-04 — End: 2015-07-04
  Filled 2015-07-04: qty 2

## 2015-07-04 MED ORDER — KETOROLAC TROMETHAMINE 30 MG/ML IJ SOLN
INTRAMUSCULAR | Status: AC
  Filled 2015-07-04: qty 1

## 2015-07-04 MED ORDER — BUPIVACAINE-EPINEPHRINE (PF) 0.5% -1:200000 IJ SOLN
INTRAMUSCULAR | Status: AC
  Filled 2015-07-04: qty 30

## 2015-07-04 MED ORDER — BUPIVACAINE-EPINEPHRINE 0.5% -1:200000 IJ SOLN
INTRAMUSCULAR | Status: DC | PRN
  Administered 2015-07-04: 5 mL

## 2015-07-04 MED ORDER — CHLORHEXIDINE GLUCONATE 4 % EX LIQD: 60.0000 mL | Freq: Once | CUTANEOUS | Status: DC

## 2015-07-04 MED ORDER — FENTANYL CITRATE (PF) 100 MCG/2ML IJ SOLN
INTRAMUSCULAR | Status: AC
  Filled 2015-07-04: qty 2

## 2015-07-04 MED ORDER — CEFAZOLIN SODIUM-DEXTROSE 2-3 GM-% IV SOLR
INTRAVENOUS | Status: AC
  Filled 2015-07-04: qty 50

## 2015-07-04 MED ORDER — LIDOCAINE HCL (CARDIAC) 20 MG/ML IV SOLN
INTRAVENOUS | Status: AC
  Filled 2015-07-04: qty 5

## 2015-07-04 MED ORDER — HYDROCODONE-ACETAMINOPHEN 5-325 MG PO TABS: 1.0000 | ORAL_TABLET | Freq: Four times a day (QID) | ORAL | Status: DC | PRN

## 2015-07-04 MED ORDER — DEXAMETHASONE SODIUM PHOSPHATE 4 MG/ML IJ SOLN
INTRAMUSCULAR | Status: DC | PRN
  Administered 2015-07-04: 10 mg via INTRAVENOUS

## 2015-07-04 MED ORDER — PROPOFOL 10 MG/ML IV BOLUS
INTRAVENOUS | Status: DC | PRN
  Administered 2015-07-04: 150 mg via INTRAVENOUS

## 2015-07-04 MED ORDER — FENTANYL CITRATE (PF) 100 MCG/2ML IJ SOLN
50.0000 ug | INTRAMUSCULAR | Status: DC | PRN
  Administered 2015-07-04: 100 ug via INTRAVENOUS

## 2015-07-04 MED ORDER — LACTATED RINGERS IV SOLN
INTRAVENOUS | Status: DC
  Administered 2015-07-04: 12:00:00 via INTRAVENOUS

## 2015-07-04 MED ORDER — CEFAZOLIN SODIUM-DEXTROSE 2-3 GM-% IV SOLR
2.0000 g | INTRAVENOUS | Status: AC
  Administered 2015-07-04: 2 g via INTRAVENOUS

## 2015-07-04 MED ORDER — LIDOCAINE HCL (CARDIAC) 20 MG/ML IV SOLN
INTRAVENOUS | Status: DC | PRN
  Administered 2015-07-04: 50 mg via INTRAVENOUS

## 2015-07-04 MED ORDER — MIDAZOLAM HCL 2 MG/2ML IJ SOLN
INTRAMUSCULAR | Status: AC
  Filled 2015-07-04: qty 2

## 2015-07-04 MED ORDER — DEXAMETHASONE SODIUM PHOSPHATE 10 MG/ML IJ SOLN
INTRAMUSCULAR | Status: AC
  Filled 2015-07-04: qty 1

## 2015-07-04 MED ORDER — ONDANSETRON HCL 4 MG/2ML IJ SOLN
INTRAMUSCULAR | Status: AC
  Filled 2015-07-04: qty 2

## 2015-07-04 MED ORDER — SCOPOLAMINE 1 MG/3DAYS TD PT72: 1.0000 | MEDICATED_PATCH | Freq: Once | TRANSDERMAL | Status: DC | PRN

## 2015-07-04 MED ORDER — KETOROLAC TROMETHAMINE 30 MG/ML IJ SOLN
INTRAMUSCULAR | Status: DC | PRN
  Administered 2015-07-04: 30 mg via INTRAVENOUS

## 2015-07-04 MED ORDER — GLYCOPYRROLATE 0.2 MG/ML IJ SOLN: 0.2000 mg | Freq: Once | INTRAMUSCULAR | Status: DC | PRN

## 2015-07-04 MED ORDER — MIDAZOLAM HCL 2 MG/2ML IJ SOLN
1.0000 mg | INTRAMUSCULAR | Status: DC | PRN
  Administered 2015-07-04: 2 mg via INTRAVENOUS

## 2015-07-04 MED ORDER — LIDOCAINE HCL 2 % IJ SOLN
INTRAMUSCULAR | Status: AC
  Filled 2015-07-04: qty 20

## 2015-07-04 SURGICAL SUPPLY — 40 items
BLADE SURG 15 STRL LF DISP TIS (BLADE) ×2 IMPLANT
BLADE SURG 15 STRL SS (BLADE) ×2
BNDG COHESIVE 4X5 TAN STRL (GAUZE/BANDAGES/DRESSINGS) ×2 IMPLANT
BNDG CONFORM 3 STRL LF (GAUZE/BANDAGES/DRESSINGS) ×2 IMPLANT
BNDG ESMARK 4X9 LF (GAUZE/BANDAGES/DRESSINGS) ×2 IMPLANT
CHLORAPREP W/TINT 26ML (MISCELLANEOUS) ×2 IMPLANT
COVER BACK TABLE 60X90IN (DRAPES) ×2 IMPLANT
DRAPE EXTREMITY T 121X128X90 (DRAPE) ×2 IMPLANT
DRAPE OEC MINIVIEW 54X84 (DRAPES) ×2 IMPLANT
DRAPE U-SHAPE 47X51 STRL (DRAPES) ×2 IMPLANT
DRSG MEPITEL 4X7.2 (GAUZE/BANDAGES/DRESSINGS) ×2 IMPLANT
DRSG PAD ABDOMINAL 8X10 ST (GAUZE/BANDAGES/DRESSINGS) ×2 IMPLANT
ELECT REM PT RETURN 9FT ADLT (ELECTROSURGICAL) ×2
ELECTRODE REM PT RTRN 9FT ADLT (ELECTROSURGICAL) ×1 IMPLANT
GAUZE SPONGE 4X4 12PLY STRL (GAUZE/BANDAGES/DRESSINGS) ×4 IMPLANT
GLOVE BIO SURGEON STRL SZ8 (GLOVE) ×2 IMPLANT
GLOVE BIOGEL M STRL SZ7.5 (GLOVE) ×2 IMPLANT
GLOVE BIOGEL PI IND STRL 8 (GLOVE) ×3 IMPLANT
GLOVE BIOGEL PI INDICATOR 8 (GLOVE) ×3
GLOVE ECLIPSE 7.5 STRL STRAW (GLOVE) ×2 IMPLANT
GLOVE EXAM NITRILE MD LF STRL (GLOVE) ×2 IMPLANT
GOWN STRL REUS W/ TWL LRG LVL3 (GOWN DISPOSABLE) ×1 IMPLANT
GOWN STRL REUS W/ TWL XL LVL3 (GOWN DISPOSABLE) ×2 IMPLANT
GOWN STRL REUS W/TWL LRG LVL3 (GOWN DISPOSABLE) ×1
GOWN STRL REUS W/TWL XL LVL3 (GOWN DISPOSABLE) ×2
NEEDLE HYPO 22GX1.5 SAFETY (NEEDLE) ×2 IMPLANT
NS IRRIG 1000ML POUR BTL (IV SOLUTION) ×2 IMPLANT
PACK BASIN DAY SURGERY FS (CUSTOM PROCEDURE TRAY) ×2 IMPLANT
PENCIL BUTTON HOLSTER BLD 10FT (ELECTRODE) ×2 IMPLANT
SANITIZER HAND PURELL 535ML FO (MISCELLANEOUS) ×2 IMPLANT
SHEET MEDIUM DRAPE 40X70 STRL (DRAPES) ×2 IMPLANT
SLEEVE SCD COMPRESS KNEE MED (MISCELLANEOUS) ×2 IMPLANT
STOCKINETTE 6  STRL (DRAPES) ×1
STOCKINETTE 6 STRL (DRAPES) ×1 IMPLANT
SUT ETHILON 3 0 PS 1 (SUTURE) ×2 IMPLANT
SUT MNCRL AB 3-0 PS2 18 (SUTURE) ×2 IMPLANT
SYR BULB 3OZ (MISCELLANEOUS) ×2 IMPLANT
SYR CONTROL 10ML LL (SYRINGE) ×2 IMPLANT
TOWEL OR 17X24 6PK STRL BLUE (TOWEL DISPOSABLE) ×2 IMPLANT
UNDERPAD 30X30 (UNDERPADS AND DIAPERS) ×2 IMPLANT

## 2015-07-04 NOTE — Anesthesia Postprocedure Evaluation (Signed)
Anesthesia Post Note  Patient: [name]  Procedure(s) Performed: Procedure(s) (LRB): EXCISION OF RIGHT FOOT MASS (Right) REMOVAL OF DEEP IMPLANT RIGHT FOOT  (Right)  Patient location during evaluation: PACU Anesthesia Type: General Level of consciousness: awake and alert and patient cooperative Pain management: pain level controlled Vital Signs Assessment: post-procedure vital signs reviewed and stable Respiratory status: spontaneous breathing and respiratory function stable Cardiovascular status: stable Anesthetic complications: no    Last Vitals:  Filed Vitals:   07/04/15 1445 07/04/15 1515  BP: 107/72 121/74  Pulse: 75 73  Temp:  36.4 C  Resp:  14    Last Pain:  Filed Vitals:   07/04/15 1521  PainSc: 0-No pain                 Leshae Mcclay S

## 2015-07-04 NOTE — H&P (Signed)
[name] is an 44 y.o. female.   Chief Complaint: right foot painful mass HPI: 44 y/o female with right foot dorsal mass at the site of previous foot surgery.  She has a pin in the 1st MT in the vicinity of the mass.  She desires excision of the mass and removal of the deep implant.  She presents to day for this surgical treatment.  Past Medical History  Diagnosis Date  . Allergic rhinitis, cause unspecified   . Menstrual migraine     resolved after getting IUD  . Eczema     elbow  . Depression     history; resolved  . Anemia     Past Surgical History  Procedure Laterality Date  . Bunionectomy  2007    bilateral  . Wisdom tooth extraction    . Dilatation & curettage/hysteroscopy with myosure N/A 10/09/2014    Procedure: DILATATION & CURETTAGE/HYSTEROSCOPY WITH RESECTION OF SUBMUCOSAL FIBROIDS AND REPAIR CERVICAL LACERATION;  Surgeon: Servando Salina, MD;  Location: [facility];  Service: Gynecology;  Laterality: N/A;  . Intrauterine device (iud) insertion N/A 10/09/2014    Procedure: Mirena INTRAUTERINE DEVICE (IUD) INSERTION;  Surgeon: Servando Salina, MD;  Location: [facility];  Service: Gynecology;  Laterality: N/A;    Family History  Problem Relation Age of Onset  . Hypertension Mother   . Hypertension Sister    Social History:  reports that she has never smoked. She has never used smokeless tobacco. She reports that she drinks alcohol. She reports that she does not use illicit drugs.  Allergies:  Allergies  Allergen Reactions  . Codeine Other (See Comments)    Hyperactivity.    Medications Prior to Admission  Medication Sig Dispense Refill  . ibuprofen (ADVIL,MOTRIN) 200 MG tablet Take 200-400 mg by mouth every 6 (six) hours as needed for headache or cramping.    Marland Kitchen levonorgestrel (MIRENA) 20 MCG/24HR IUD 1 each by Intrauterine route once.    Marland Kitchen OVER THE COUNTER MEDICATION Place 1 drop into both eyes as needed (allergies). Over the counter allergy relief eye drop.       No results found for this or any previous visit (from the past 48 hour(s)). No results found.  ROS  No recent f/c/n/v/wt loss  Blood pressure 100/64, temperature 98 F (36.7 C), temperature source Oral, resp. rate 16, height 5\' 5"  (1.651 m), weight 72.122 kg (159 lb), last menstrual period 06/16/2015, SpO2 100 %. Physical Exam  wn wd woman in nad.  A and O x 4.  Mood and affe3ct normal.  EOMI.  Resp unlabored.  R foot with healed surcial incision dorsal medially.  Skin o/w heatlhy and intact.  Sens to LT intact.  5/5 strength in PF and DF of the hallux.  TTP at the mass which is 1 cm in diameter.  Assessment/Plan R foot mass and painful hardware - to OR for removal of deep implant and excision of the mass.  The risks and benefits of the alternative treatment options have been discussed in detail.  The patient wishes to proceed with surgery and specifically understands risks of bleeding, infection, nerve damage, blood clots, need for additional surgery, amputation and death.   Wylene Simmer 30-Jul-2015, 1:26 PM

## 2015-07-04 NOTE — Brief Op Note (Signed)
07/04/2015  2:17 PM  PATIENT:  [name]  44 y.o. female  PRE-OPERATIVE DIAGNOSIS:  right foot mass, painful hardware right foot  POST-OPERATIVE DIAGNOSIS:  right foot mass, painful hardware right foot  Procedure(s): 1.  EXCISION OF RIGHT FOOT MASS (1 cm x 1 cm x 1 cm) 2.  REMOVAL OF DEEP IMPLANT RIGHT FOOT  3.  AP and lateral xray of the right foot  SURGEON:  Wylene Simmer, MD  ASSISTANT: n/a  ANESTHESIA:   General  EBL:  minimal   TOURNIQUET:   Total Tourniquet Time Documented: area (Right) - 5 minutes Total: area (Right) - 5 minutes  COMPLICATIONS:  None apparent  DISPOSITION:  Extubated, awake and stable to recovery.  DICTATION ID:  [id number]

## 2015-07-04 NOTE — Discharge Instructions (Signed)
Donna Simmer, MD [place]  Please read the following information regarding your care after surgery.  Medications  You only need a prescription for the narcotic pain medicine (ex. oxycodone, Percocet, Norco).  All of the other medicines listed below are available over the counter. X ibuprofen 800 mg every 6-8 hours as you need for mild to moderate pain X Norco as prescribed for severe pain   Weight Bearing X Bear weight when you are able on your operated leg or foot in the post-op shoe.  Cast / Splint / Dressing ? Keep your splint or cast clean and dry.  Dont put anything (coat hanger, pencil, etc) down inside of it.  If it gets damp, use a hair dryer on the cool setting to dry it.  If it gets soaked, call the office to schedule an appointment for a cast change. X Remove your dressing 3 days after surgery and cover the incisions with dry dressings.    After your dressing, cast or splint is removed; you may shower, but do not soak or scrub the wound.  Allow the water to run over it, and then gently pat it dry.  Swelling It is normal for you to have swelling where you had surgery.  To reduce swelling and pain, keep your toes above your nose for at least 3 days after surgery.  It may be necessary to keep your foot or leg elevated for several weeks.  If it hurts, it should be elevated.  Follow Up Call my office at [phone] when you are discharged from the hospital or surgery center to schedule an appointment to be seen two weeks after surgery.  Call my office at [phone] if you develop a fever >101.5 F, nausea, vomiting, bleeding from the surgical site or severe pain.     Post Anesthesia Home Care Instructions  Activity: Get plenty of rest for the remainder of the day. A responsible adult should stay with you for 24 hours following the procedure.  For the next 24 hours, DO NOT: -Drive a car -Paediatric nurse -Drink alcoholic beverages -Take any medication  unless instructed by your physician -Make any legal decisions or sign important papers.  Meals: Start with liquid foods such as gelatin or soup. Progress to regular foods as tolerated. Avoid greasy, spicy, heavy foods. If nausea and/or vomiting occur, drink only clear liquids until the nausea and/or vomiting subsides. Call your physician if vomiting continues.  Special Instructions/Symptoms: Your throat may feel dry or sore from the anesthesia or the breathing tube placed in your throat during surgery. If this causes discomfort, gargle with warm salt water. The discomfort should disappear within 24 hours.  If you had a scopolamine patch placed behind your ear for the management of post- operative nausea and/or vomiting:  1. The medication in the patch is effective for 72 hours, after which it should be removed.  Wrap patch in a tissue and discard in the trash. Wash hands thoroughly with soap and water. 2. You may remove the patch earlier than 72 hours if you experience unpleasant side effects which may include dry mouth, dizziness or visual disturbances. 3. Avoid touching the patch. Wash your hands with soap and water after contact with the patch.

## 2015-07-04 NOTE — Anesthesia Preprocedure Evaluation (Signed)
Anesthesia Evaluation  Patient identified by MRN, date of birth, ID band Patient awake    Reviewed: Allergy & Precautions, NPO status , Patient's Chart, lab work & pertinent test results  Airway Mallampati: II   Neck ROM: full    Dental   Pulmonary    breath sounds clear to auscultation       Cardiovascular negative cardio ROS   Rhythm:regular Rate:Normal     Neuro/Psych  Headaches, PSYCHIATRIC DISORDERS Depression    GI/Hepatic   Endo/Other    Renal/GU      Musculoskeletal   Abdominal   Peds  Hematology   Anesthesia Other Findings   Reproductive/Obstetrics                             Anesthesia Physical Anesthesia Plan  ASA: II  Anesthesia Plan: General   Post-op Pain Management:    Induction: Intravenous  Airway Management Planned: LMA  Additional Equipment:   Intra-op Plan:   Post-operative Plan:   Informed Consent: I have reviewed the patients History and Physical, chart, labs and discussed the procedure including the risks, benefits and alternatives for the proposed anesthesia with the patient or authorized representative who has indicated his/her understanding and acceptance.     Plan Discussed with: CRNA, Anesthesiologist and Surgeon  Anesthesia Plan Comments:         Anesthesia Quick Evaluation

## 2015-07-04 NOTE — Anesthesia Procedure Notes (Signed)
Procedure Name: LMA Insertion Performed by: Jacquelyn Antony W Pre-anesthesia Checklist: Patient identified, Emergency Drugs available, Suction available and Patient being monitored Patient Re-evaluated:Patient Re-evaluated prior to inductionOxygen Delivery Method: Circle System Utilized Preoxygenation: Pre-oxygenation with 100% oxygen Intubation Type: IV induction Ventilation: Mask ventilation without difficulty LMA: LMA inserted LMA Size: 4.0 Number of attempts: 1 Airway Equipment and Method: Bite block Placement Confirmation: positive ETCO2 Tube secured with: Tape Dental Injury: Teeth and Oropharynx as per pre-operative assessment      

## 2015-07-04 NOTE — Transfer of Care (Signed)
Immediate Anesthesia Transfer of Care Note  Patient: [name]  Procedure(s) Performed: Procedure(s): EXCISION OF RIGHT FOOT MASS (Right) REMOVAL OF DEEP IMPLANT RIGHT FOOT  (Right)  Patient Location: PACU  Anesthesia Type:General  Level of Consciousness: awake and sedated  Airway & Oxygen Therapy: Patient Spontanous Breathing and Patient connected to face mask oxygen  Post-op Assessment: Report given to RN and Post -op Vital signs reviewed and stable  Post vital signs: Reviewed and stable  Last Vitals:  Filed Vitals:   07/04/15 1157  BP: 100/64  Temp: 36.7 C  Resp: 16    Complications: No apparent anesthesia complications

## 2015-07-04 NOTE — Op Note (Signed)
[name], [name]                ACCOUNT NO.:  [id number]  MEDICAL RECORD NO.:  [id number]  LOCATION:                                 FACILITY:  PHYSICIAN:  Wylene Simmer, MD        DATE OF BIRTH:  09-Apr-1971  DATE OF PROCEDURE:  07/04/2015 DATE OF DISCHARGE:                              OPERATIVE REPORT   PREOPERATIVE DIAGNOSES: 1. Right foot mass. 2. Painful hardware, right foot.  POSTOPERATIVE DIAGNOSES: 1. Right foot mass. 2. Painful hardware, right foot.  PROCEDURE: 1. Excision of right foot mass measuring 1 cm x 1 cm x 1 cm. 2. Removal of deep implant from the right foot first metatarsal. 3. AP and lateral radiographs of the right foot.  SURGEON:  Wylene Simmer, MD.  ANESTHESIA:  General.  ESTIMATED BLOOD LOSS:  Minimal.  TOURNIQUET TIME:  5 minutes with an ankle Esmarch.  SPECIMEN:  Right foot mass to Pathology.  COMPLICATIONS:  None apparent.  DISPOSITION:  Extubated, awake, and stable to recovery.  INDICATIONS FOR PROCEDURE:  The patient is a 44 year old woman who underwent bunion correction by a podiatrist in the remote past.  She developed pain and swelling at the dorsum of the foot adjacent to the previous incision.  On physical exam, she has a subcutaneous mass in this area.  Radiographs reveal a K-wire still in the first metatarsal adjacent to the area of the mass.  She presents today for excision of the mass and removal of the deep implants.  She understands the risks and benefits, the alternative treatment options, and elects surgical treatment.  She specifically understands risks of bleeding, infection, nerve damage, blood clots, need for additional surgery, continued pain, recurrence of the mass, amputation, and death.  PROCEDURE IN DETAIL:  After preoperative consent was obtained and the correct operative site was identified, the patient was brought to the operating room and placed supine on the operating table.  General anesthesia was induced.   Preoperative antibiotics were administered. Surgical time-out was taken.  The right lower extremity was prepped and draped in standard sterile fashion.  Foot was exsanguinated and a 4-inch Esmarch tourniquet was wrapped around the ankle.  The previous dorsal medial incision [name] the first metatarsal was identified.  The proximal extent of the incision was opened again sharply and dissection was carried down through the skin and subcutaneous tissue.  The mass was identified.  It was circumferentially dissected free from the surrounding soft tissue.  It appeared well encapsulated.  It did not appear to be a ganglion cyst but rather had the appearance of reactive granulomatous tissue.  This was sent as a specimen to Pathology.  The K- wire was identified at the dorsal cortex and it was removed with a needle driver.  Wound was irrigated.  The tourniquet was released. Hemostasis was achieved.  Subcutaneous tissues were approximated with inverted simple sutures of 3-0 Monocryl, and the skin was closed with a running 3-0 nylon. Sterile dressings were applied followed by compression wrap.  The patient was awakened from anesthesia and transported to the recovery room in stable condition.  FOLLOWUP PLAN:  The patient will be weightbearing as tolerated on  the right foot in a flat postop shoe.  She will follow up with me in the office in 2 weeks for suture removal and pathology results.  RADIOGRAPHS:  AP and lateral radiographs of the right foot were obtained intraoperatively.  These show interval removal of the K-wire from the first metatarsal.  No other acute injuries are noted.     Wylene Simmer, MD     JH/MEDQ  D:  07/04/2015  T:  07/04/2015  Job:  [id number]

## 2015-07-05 ENCOUNTER — Encounter (HOSPITAL_BASED_OUTPATIENT_CLINIC_OR_DEPARTMENT_OTHER): Payer: Self-pay | Admitting: Orthopedic Surgery

## 2016-12-07 ENCOUNTER — Ambulatory Visit: Payer: Managed Care, Other (non HMO) | Admitting: Family Medicine

## 2017-01-08 ENCOUNTER — Ambulatory Visit (INDEPENDENT_AMBULATORY_CARE_PROVIDER_SITE_OTHER): Payer: 59 | Admitting: Family Medicine

## 2017-01-08 ENCOUNTER — Ambulatory Visit: Payer: Managed Care, Other (non HMO) | Admitting: Family Medicine

## 2017-01-08 ENCOUNTER — Encounter: Payer: Self-pay | Admitting: Family Medicine

## 2017-01-08 VITALS — BP 100/70 | HR 83 | Wt 166.0 lb

## 2017-01-08 DIAGNOSIS — M79672 Pain in left foot: Secondary | ICD-10-CM | POA: Diagnosis not present

## 2017-01-08 DIAGNOSIS — S90861A Insect bite (nonvenomous), right foot, initial encounter: Secondary | ICD-10-CM

## 2017-01-08 DIAGNOSIS — W57XXXA Bitten or stung by nonvenomous insect and other nonvenomous arthropods, initial encounter: Secondary | ICD-10-CM

## 2017-01-08 NOTE — Progress Notes (Signed)
   Subjective:    Patient ID: [name], female    DOB: 06-25-1971, 46 y.o.   MRN: [id number]  HPI She noted a tick on her foot on June 10 and did go to an urgent care center. The tick was removed. She states that it was on probably less than 24 hours. Since then she has had no difficulty except with slight bump were the tick was removed. She also is complaining of right second toe pain especially when she runs P she does note that she does need a new pair of running shoes. She is running usually 15 miles per week on cement.     Review of Systems     Objective:   Physical Exam Exam of the right foot does show a 2-3 mm raised slightly pigmented area on the dorsum of the foot. Exam of the great toe and second toe shows recent surgery on the great toe and some callus formation on the plantar surface of the second toe.       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 I then discussed the pain she is having and explained that this is probably related to needing a new [employer] shoes. Make sure that they fit properly and if she can change the running service that would probably also benefit her.

## 2017-04-06 ENCOUNTER — Ambulatory Visit (INDEPENDENT_AMBULATORY_CARE_PROVIDER_SITE_OTHER): Payer: 59 | Admitting: Family Medicine

## 2017-04-06 ENCOUNTER — Encounter: Payer: Self-pay | Admitting: Family Medicine

## 2017-04-06 VITALS — BP 102/68 | HR 88 | Wt 162.0 lb

## 2017-04-06 DIAGNOSIS — B351 Tinea unguium: Secondary | ICD-10-CM

## 2017-04-06 MED ORDER — TERBINAFINE HCL 250 MG PO TABS: 250.0000 mg | ORAL_TABLET | Freq: Every day | ORAL | 0 refills | Status: DC

## 2017-04-06 NOTE — Progress Notes (Signed)
   Subjective:    Patient ID: [name], female    DOB: 1971-03-28, 46 y.o.   MRN: [id number]  HPI She is here for evaluation of a 10 day history of left great toe pain. No history of injury to the toe. She does run frequently. No other joints are involved.   Review of Systems     Objective:   Physical Exam Left foot exam does show thickening of the nails especially the great toe. She does have toenail polish on and therefore thorough evaluation was difficult to do. The base of the nail did appear normal.       Assessment & Plan:  Onychomycosis - Plan: terbinafine (LAMISIL) 250 MG tablet I explained that her toe pain could easily be related to onychomycosis but could not absolutely verify that due to her polish. She was comfortable with that assessment and we will place her on Lamisil. Discussed the need for returning in 3 months for recheck and blood work.

## 2017-08-23 ENCOUNTER — Ambulatory Visit: Payer: 59 | Admitting: Family Medicine

## 2017-08-23 ENCOUNTER — Encounter: Payer: Self-pay | Admitting: Family Medicine

## 2017-08-23 VITALS — BP 110/62 | HR 96 | Wt 166.6 lb

## 2017-08-23 DIAGNOSIS — B351 Tinea unguium: Secondary | ICD-10-CM

## 2017-08-23 DIAGNOSIS — Z79899 Other long term (current) drug therapy: Secondary | ICD-10-CM

## 2017-08-23 LAB — COMPREHENSIVE METABOLIC PANEL
ALBUMIN: 3.8 g/dL (ref 3.5–5.5)
ALT: 8 IU/L (ref 0–32)
AST: 13 IU/L (ref 0–40)
Albumin/Globulin Ratio: 1.3 (ref 1.2–2.2)
Alkaline Phosphatase: 59 IU/L (ref 39–117)
BUN / CREAT RATIO: 10 (ref 9–23)
BUN: 9 mg/dL (ref 6–24)
Bilirubin Total: 0.6 mg/dL (ref 0.0–1.2)
CALCIUM: 8.7 mg/dL (ref 8.7–10.2)
CO2: 24 mmol/L (ref 20–29)
CREATININE: 0.89 mg/dL (ref 0.57–1.00)
Chloride: 103 mmol/L (ref 96–106)
GFR calc Af Amer: 89 mL/min/{1.73_m2} (ref >59)
GFR, EST NON AFRICAN AMERICAN: 77 mL/min/{1.73_m2} (ref >59)
GLOBULIN, TOTAL: 2.9 g/dL (ref 1.5–4.5)
GLUCOSE: 91 mg/dL (ref 65–99)
Potassium: 4.2 mmol/L (ref 3.5–5.2)
SODIUM: 139 mmol/L (ref 134–144)
Total Protein: 6.7 g/dL (ref 6.0–8.5)

## 2017-08-23 MED ORDER — TERBINAFINE HCL 250 MG PO TABS: 250.0000 mg | ORAL_TABLET | Freq: Every day | ORAL | 1 refills | Status: AC

## 2017-08-23 NOTE — Progress Notes (Signed)
   Subjective:    Patient ID: [name], female    DOB: [birth year], 47 y.o.   MRN: [id number]  HPI She is here for recheck.  She has been on Lamisil for 3 months and does think it is helping her.  She also has had some left elbow pain but cannot relate this to position of the elbow shoulders or wrists.   Review of Systems     Objective:   Physical Exam Alert and in no distress.  Full motion of the elbow.  No palpable tenderness to the joint.  Good strength.  No tenderness to palpation to other areas.       Assessment & Plan:  Onychomycosis - Plan: Comprehensive metabolic panel  Encounter for long-term (current) use of medications - Plan: Comprehensive metabolic panel I will continue her on Lamisil.  Also recommend she keep track of when she has the elbow pain in terms of position of shoulder, elbow and wrist.

## 2017-11-12 ENCOUNTER — Encounter: Payer: Self-pay | Admitting: Family Medicine

## 2017-11-12 ENCOUNTER — Ambulatory Visit: Payer: 59 | Admitting: Family Medicine

## 2017-11-12 VITALS — BP 120/70 | HR 67 | Temp 97.5°F | Ht 65.0 in | Wt 167.2 lb

## 2017-11-12 DIAGNOSIS — H6123 Impacted cerumen, bilateral: Secondary | ICD-10-CM

## 2017-11-12 NOTE — Progress Notes (Signed)
   Subjective:    Patient ID: [name], female    DOB: December 12, 1970, 47 y.o.   MRN: [id number]  HPI She is having trouble with earwax in both ears.   Review of Systems     Objective:   Physical Exam  Both canals have wax present      Assessment & Plan:  Bilateral impacted cerumen Lavage was attempted but only one year was cleared.  The nurse will schedule back next week.

## 2017-11-18 ENCOUNTER — Ambulatory Visit: Payer: Self-pay | Admitting: Family Medicine

## 2018-02-10 ENCOUNTER — Ambulatory Visit: Payer: 59 | Admitting: Family Medicine

## 2018-02-10 ENCOUNTER — Encounter: Payer: Self-pay | Admitting: Family Medicine

## 2018-02-10 VITALS — BP 122/74 | HR 61 | Temp 97.8°F | Wt 167.8 lb

## 2018-02-10 DIAGNOSIS — M25562 Pain in left knee: Secondary | ICD-10-CM | POA: Diagnosis not present

## 2018-02-10 NOTE — Progress Notes (Signed)
   Subjective:    Patient ID: [name], female    DOB: Mar 11, 1971, 47 y.o.   MRN: [id number]  HPI She complains of intermittent left medial posterior lateral   Review of Systems     Objective:   Physical Exam No effusion.  No palpable tenderness to the joint line.  McMurray's testing negative negative anterior drawer.  Medial and lateral collateral ligaments intact.  No posterior knee pain.       Assessment & Plan:  Left knee pain, unspecified chronicity Recommend supportive care.  Call if further difficulty.

## 2018-05-04 LAB — HM MAMMOGRAPHY

## 2018-06-08 ENCOUNTER — Encounter: Payer: Self-pay | Admitting: Medical

## 2018-06-08 ENCOUNTER — Ambulatory Visit: Payer: 59 | Admitting: Medical

## 2018-06-08 VITALS — BP 110/68 | HR 78 | Temp 98.0°F | Resp 16 | Ht 65.0 in | Wt 173.2 lb

## 2018-06-08 DIAGNOSIS — L84 Corns and callosities: Secondary | ICD-10-CM

## 2018-06-08 DIAGNOSIS — M79675 Pain in left toe(s): Secondary | ICD-10-CM

## 2018-06-08 NOTE — Progress Notes (Signed)
Subjective:  [name] is a 47 y.o. female who presents for Chief Complaint  Patient presents with  . callius on toe    callius on toe left middle toe     Here for left second toe pain.  She ran a marathon several weeks ago and since then has had callus area that is tender on her left second toe.  She had been training for months for the marathon.  She has tried doing pumice stone but this hurts.  She wants some other advice.  She has had prior bunion surgery for left foot.  No other aggravating or relieving factors.    No other c/o.  The following portions of the patient's history were reviewed and updated as appropriate: allergies, current medications, past family history, past medical history, past social history, past surgical history and problem list.  ROS Otherwise as in subjective above  Objective: BP 110/68   Pulse 78   Temp 98 F (36.7 C) (Oral)   Resp 16   Ht 5\' 5"  (1.651 m)   Wt 173 lb 3.2 oz (78.6 kg)   SpO2 99%   BMI 28.82 kg/m   General appearance: alert, no distress, well developed, well nourished Left second toe tip of toe with thickened callus tender with pressure, toenail thickening, left great toe with surgical scar from prior bunion surgery Otherwise feet with moderate arches and neurovascularly intact   Assessment: Encounter Diagnoses  Name Primary?  . Corn or callus Yes  . Toe pain, left      Plan: With her consent I used a flexible razor to remove thickened callus tissue from the second toe callus.  Discussed some home foot care, advised open toe sandal for the next week to keep from having friction against the toe,, advised using shoe footwear that will limit rubbing or pressure against the second toe.  Advised she cut back on running or other activity that would put pressure against the toe for the next few weeks.   Melanie was seen today for callius on toe.  Diagnoses and all orders for this visit:  Corn or callus  Toe pain,  left    Follow up: prn

## 2018-06-09 ENCOUNTER — Ambulatory Visit: Payer: 59 | Admitting: Medical

## 2018-06-28 ENCOUNTER — Telehealth: Payer: Self-pay | Admitting: Medical

## 2018-06-28 NOTE — Telephone Encounter (Signed)
Call and see if any improvement since her last visit?

## 2018-06-28 NOTE — Telephone Encounter (Signed)
LVM for pt to call office with update. [place]

## 2018-07-15 NOTE — Telephone Encounter (Signed)
Call and see if improved/resolved since last visit here?

## 2018-07-15 NOTE — Telephone Encounter (Signed)
No answer lvm for pt to call back with info. [place]

## 2018-07-18 NOTE — Telephone Encounter (Signed)
Please have Dr. Redmond School touch base with her on her foot issue as he probably has better recommendations.  See her last visit notes

## 2018-07-19 NOTE — Telephone Encounter (Signed)
Check with her.  If she still has difficulty, podiatry would be reasonable

## 2018-07-19 NOTE — Telephone Encounter (Signed)
Please advise if pt should come in to see you or any recommendations for pt . Per Audelia Acton see last office note. Thanks [employer]

## 2018-07-21 NOTE — Telephone Encounter (Signed)
LVM for pt to call if she would like to have a referral for podiatry. [name]
